# Patient Record
Sex: Female | Born: 1975 | Race: White | Hispanic: No | Marital: Married | Smoking: Never smoker
Health system: Southern US, Community
[De-identification: ages and names within clinical notes are randomized; demographics above are authoritative.]

## PROBLEM LIST (undated history)

## (undated) DIAGNOSIS — F4323 Adjustment disorder with mixed anxiety and depressed mood: Secondary | ICD-10-CM

## (undated) DIAGNOSIS — R87610 Atypical squamous cells of undetermined significance on cytologic smear of cervix (ASC-US): Secondary | ICD-10-CM

## (undated) DIAGNOSIS — F419 Anxiety disorder, unspecified: Secondary | ICD-10-CM

## (undated) DIAGNOSIS — E559 Vitamin D deficiency, unspecified: Secondary | ICD-10-CM

## (undated) DIAGNOSIS — Z87898 Personal history of other specified conditions: Secondary | ICD-10-CM

## (undated) DIAGNOSIS — K589 Irritable bowel syndrome without diarrhea: Secondary | ICD-10-CM

## (undated) DIAGNOSIS — T7840XA Allergy, unspecified, initial encounter: Secondary | ICD-10-CM

## (undated) HISTORY — DX: Adjustment disorder with mixed anxiety and depressed mood: F43.23

## (undated) HISTORY — DX: Personal history of other specified conditions: Z87.898

## (undated) HISTORY — PX: ABDOMINAL HYSTERECTOMY: SHX81

## (undated) HISTORY — PX: TONSILLECTOMY: SUR1361

## (undated) HISTORY — DX: Anxiety disorder, unspecified: F41.9

## (undated) HISTORY — DX: Irritable bowel syndrome, unspecified: K58.9

## (undated) HISTORY — PX: ENDOMETRIAL CRYOABLATION: SHX1499

## (undated) HISTORY — DX: Vitamin D deficiency, unspecified: E55.9

## (undated) HISTORY — PX: EYE MUSCLE SURGERY: SHX370

## (undated) HISTORY — DX: Allergy, unspecified, initial encounter: T78.40XA

## (undated) HISTORY — DX: Atypical squamous cells of undetermined significance on cytologic smear of cervix (ASC-US): R87.610

## (undated) HISTORY — PX: WISDOM TOOTH EXTRACTION: SHX21

---

## 2000-07-14 ENCOUNTER — Other Ambulatory Visit: Admission: RE | Admit: 2000-07-14 | Discharge: 2000-07-14 | Payer: Self-pay | Admitting: Family Medicine

## 2001-07-27 ENCOUNTER — Other Ambulatory Visit: Admission: RE | Admit: 2001-07-27 | Discharge: 2001-07-27 | Payer: Self-pay | Admitting: Family Medicine

## 2002-08-30 ENCOUNTER — Other Ambulatory Visit: Admission: RE | Admit: 2002-08-30 | Discharge: 2002-08-30 | Payer: Self-pay | Admitting: Family Medicine

## 2004-05-14 ENCOUNTER — Ambulatory Visit: Payer: Self-pay | Admitting: Family Medicine

## 2004-08-12 ENCOUNTER — Emergency Department (HOSPITAL_COMMUNITY): Admission: EM | Admit: 2004-08-12 | Discharge: 2004-08-12 | Payer: Self-pay | Admitting: Emergency Medicine

## 2004-09-17 ENCOUNTER — Ambulatory Visit: Payer: Self-pay | Admitting: Family Medicine

## 2004-10-11 ENCOUNTER — Ambulatory Visit: Payer: Self-pay | Admitting: Family Medicine

## 2004-11-05 ENCOUNTER — Encounter: Admission: RE | Admit: 2004-11-05 | Discharge: 2004-11-05 | Payer: Self-pay | Admitting: Family Medicine

## 2005-06-16 ENCOUNTER — Ambulatory Visit: Payer: Self-pay | Admitting: Family Medicine

## 2005-07-29 ENCOUNTER — Ambulatory Visit: Payer: Self-pay | Admitting: Family Medicine

## 2005-09-14 ENCOUNTER — Ambulatory Visit: Payer: Self-pay | Admitting: Family Medicine

## 2007-09-07 LAB — HEPATIC FUNCTION PANEL
ALT: 12 U/L (ref 7–35)
AST: 19 U/L (ref 13–35)
Alkaline Phosphatase: 60 U/L (ref 25–125)
Bilirubin, Total: 0.6 mg/dL

## 2007-12-07 ENCOUNTER — Ambulatory Visit (HOSPITAL_BASED_OUTPATIENT_CLINIC_OR_DEPARTMENT_OTHER): Admission: RE | Admit: 2007-12-07 | Discharge: 2007-12-07 | Payer: Self-pay | Admitting: Ophthalmology

## 2009-03-27 LAB — CBC AND DIFFERENTIAL
HCT: 40 % (ref 36–46)
Hemoglobin: 12.8 g/dL (ref 12.0–16.0)
PLATELETS: 176 10*3/uL (ref 150–399)
WBC: 3.9 10*3/mL

## 2009-03-27 LAB — BASIC METABOLIC PANEL
BUN: 19 mg/dL (ref 4–21)
CREATININE: 0.7 mg/dL (ref <=1.1)
GLUCOSE: 89 mg/dL
POTASSIUM: 5.1 mmol/L (ref 3.4–5.3)
Sodium: 139 mmol/L (ref 137–147)

## 2010-07-18 ENCOUNTER — Encounter: Payer: Self-pay | Admitting: Family Medicine

## 2010-11-09 NOTE — Op Note (Signed)
Victoria Burch, Victoria Burch             ACCOUNT NO.:  [id number]   MEDICAL RECORD NO.:  [id number]          PATIENT TYPE:  AMB   LOCATION:  DSC                          FACILITY:  [facility]   PHYSICIAN:  Pasty Spillers. Maple Hudson, M.D. DATE OF BIRTH:  Dec 16, 1975   DATE OF PROCEDURE:  12/07/2007  DATE OF DISCHARGE:                               OPERATIVE REPORT   PREOPERATIVE DIAGNOSIS:  Exotropia.   POSTOPERATIVE DIAGNOSIS:  Exotropia.   PROCEDURE:  Left lateral rectus muscle recession, 7.0 mm, adjustable  technique.   SURGEON:  Pasty Spillers. Young, MD   ANESTHESIA:  General (laryngeal mask).   COMPLICATIONS:  None.   DESCRIPTION OF PROCEDURE:  After routine preop evaluation including  informed consent, the patient was taken to the operating room where she  was identified by me.  General anesthesia was induced without difficulty  after placement of appropriate monitors.  The patient was prepped and  draped in standard sterile fashion.  A lid speculum was placed in each  eye in turn and exaggerated forced traction testing was carried out,  confirming moderate tightness of each superior oblique tendon.   A limbal conjunctival peritomy of 2 o'clock hours extent was made  temporally in the left eye with Westcott scissors, with relaxing  incisions in the superotemporal and inferotemporal quadrants.  The left  lateral rectus muscle was engaged on a series of muscle hooks and  cleared of its fascial attachments and scar tissue from its two previous  surgeries.  The tendon was secured with a double-arm 6-0 Vicryl suture,  with a double-locking bite at each border of the muscle, 1 mm from the  insertion.  The muscle was disinserted.  The current insertion was  measured to be 7.0 mm posterior to the limbus.  Each pole suture was  passed back through the original stump in crossed swords fashion.  The  muscle was drawn up to the level of the original insertion.  The pole  sutures were tied together 10 cm  above sclera.  The pole sutures were  joined together with a needle driver at a measured distance of 7.0 mm  above sclera, and a noose suture of 6-0 Vicryl was passed around the  pole sutures at this location.  The muscle was allowed to hang back  until the new suture reached sclera, effecting a 7.0 mm adjustable hang  back.  The 6-0 silk traction suture which had been placed at the  superior and inferior limbus at the beginning of the case was moved to  the nasal limbus.  The superior corner of the conjunctival flap was  reattached to the sclera with a 6-0 plain gut suture, just superior and  temporal to the superior end of the lateral rectus insertion.  The  relaxing incision was closed with a single 6-0 plain gut suture.  The  inferior corner of the flap was joined to the limbus with a large loop  of 6-0 Vicryl, which was left open to allow access to the muscle for  suture adjustment.  The limbal peritomy was continued superiorly, and  the left superior rectus  muscle was engaged on a series of muscle hooks.  The muscle was found approximately 10 mm posterior to the limbus.  It  was not possible to identify the superior oblique insertion under the  superior rectus muscle, and it was elected not to disinsert the superior  rectus so as to avoid the possibility of disrupting the superior oblique  insertion.  Thus, the original plan of bilateral superior oblique  posterior tenotomy was abandoned.  It was elected not to do a full nasal  tenotomy of each superior oblique.  The temporal corner of the superior  conjunctival flap was re-apposed to limbus with a 6-0 plain gut suture.  The pole, noose, and traction  sutures were taped to the left cheek, and TobraDex ointment was placed  in the left eye.  The patient was awakened without difficulty and taken  to the recovery room in stable condition, having suffered no  intraoperative or immediate postoperative complications.      Pasty Spillers.  Maple Hudson, M.D.  Electronically Signed     WOY/MEDQ  D:  12/07/2007  T:  12/08/2007  Job:  [id number]

## 2011-03-24 LAB — POCT HEMOGLOBIN-HEMACUE: Hemoglobin: 13.5

## 2012-01-25 ENCOUNTER — Ambulatory Visit (INDEPENDENT_AMBULATORY_CARE_PROVIDER_SITE_OTHER): Payer: BC Managed Care – PPO | Admitting: Medical

## 2012-01-25 ENCOUNTER — Encounter: Payer: Self-pay | Admitting: Medical

## 2012-01-25 VITALS — BP 110/70 | HR 79 | Temp 98.6°F | Resp 16 | Ht 62.0 in | Wt 138.0 lb

## 2012-01-25 DIAGNOSIS — J029 Acute pharyngitis, unspecified: Secondary | ICD-10-CM

## 2012-01-25 LAB — POCT RAPID STREP A (OFFICE): Rapid Strep A Screen: NEGATIVE

## 2012-01-25 MED ORDER — AMOXICILLIN 875 MG PO TABS: 875.0000 mg | ORAL_TABLET | Freq: Two times a day (BID) | ORAL | Status: AC

## 2012-01-25 NOTE — Progress Notes (Signed)
Subjective: New patient today, friend of mine here for c/o sore throat x 3 days since Monday.   Glands feel swollen, feels nausea, headache, but no fever.  Her daughter Baxter Hire was + for strep Monday.  Using salt water gargles, Ibuprofen.   She does have hx/o tonsillectomy.     Objective:      Filed Vitals:   01/25/12 1426  BP: 110/70  Pulse: 79  Temp: 98.6 F (37 C)  Resp: 16    General appearance: no distress, WD/WN, somewhat ill-appearing HEENT: normocephalic, conjunctiva/corneas normal, sclerae anicteric, nares patent, no discharge or erythema,TMs pearly, pharynx with erythema, no exudate.  Oral cavity: MMM, no lesions  Neck: supple, shoddy lymphadenopathy, no thyromegaly Lungs: CTA bilaterally, no wheezes, rhonchi, or rales  Laboratory Strep test done. Results:negative.    Assessment and Plan:   Encounter Diagnosis  Name Primary?  . Pharyngitis Yes    Given worse symptoms, +strep contact in household, and at least 2+ centor criteria, will cover for possible strep.  Script for amoxicillin.  Discussed symptomatic treatment including salt water gargles, warm fluids, rest, hydrate well, can use over-the-counter Tylenol or Ibuprofen for throat pain, fever, or malaise. If worse or not improving within 2-3 days, call or return.

## 2012-01-25 NOTE — Addendum Note (Signed)
Addended by: Janeice Robinson on: 01/25/2012 02:55 PM   Modules accepted: Orders

## 2013-05-02 ENCOUNTER — Other Ambulatory Visit: Payer: Self-pay | Admitting: Medical

## 2013-05-02 MED ORDER — POLYMYXIN B-TRIMETHOPRIM 10000-0.1 UNIT/ML-% OP SOLN: 1.0000 [drp] | OPHTHALMIC | Status: DC

## 2015-08-27 ENCOUNTER — Other Ambulatory Visit (INDEPENDENT_AMBULATORY_CARE_PROVIDER_SITE_OTHER): Payer: BLUE CROSS/BLUE SHIELD

## 2015-08-27 DIAGNOSIS — Z23 Encounter for immunization: Secondary | ICD-10-CM

## 2015-11-10 DIAGNOSIS — D485 Neoplasm of uncertain behavior of skin: Secondary | ICD-10-CM | POA: Diagnosis not present

## 2015-11-10 DIAGNOSIS — D225 Melanocytic nevi of trunk: Secondary | ICD-10-CM | POA: Diagnosis not present

## 2015-11-10 DIAGNOSIS — D1801 Hemangioma of skin and subcutaneous tissue: Secondary | ICD-10-CM | POA: Diagnosis not present

## 2015-11-10 DIAGNOSIS — D2271 Melanocytic nevi of right lower limb, including hip: Secondary | ICD-10-CM | POA: Diagnosis not present

## 2015-12-02 DIAGNOSIS — F411 Generalized anxiety disorder: Secondary | ICD-10-CM | POA: Diagnosis not present

## 2015-12-17 ENCOUNTER — Encounter: Payer: Self-pay | Admitting: General Practice

## 2016-01-15 ENCOUNTER — Encounter: Payer: Self-pay | Admitting: Family Medicine

## 2016-01-15 ENCOUNTER — Ambulatory Visit (INDEPENDENT_AMBULATORY_CARE_PROVIDER_SITE_OTHER): Payer: BLUE CROSS/BLUE SHIELD | Admitting: Family Medicine

## 2016-01-15 VITALS — BP 106/76 | HR 71 | Temp 98.1°F | Resp 16 | Ht 62.0 in | Wt 149.0 lb

## 2016-01-15 DIAGNOSIS — Q678 Other congenital deformities of chest: Secondary | ICD-10-CM | POA: Insufficient documentation

## 2016-01-15 DIAGNOSIS — Z0000 Encounter for general adult medical examination without abnormal findings: Secondary | ICD-10-CM

## 2016-01-15 NOTE — Assessment & Plan Note (Signed)
Pt's PE WNL w/ exception of chest asymmetry.  UTD on Tdap.  No need for pap.  Check labs.  Anticipatory guidance provided.

## 2016-01-15 NOTE — Progress Notes (Signed)
Pre visit review using our clinic review tool, if applicable. No additional management support is needed unless otherwise documented below in the visit note. 

## 2016-01-15 NOTE — Assessment & Plan Note (Signed)
New.  Pt w/ L chest wall abnormality at sternal/rib junction.  + TTP.  Get [name] to assess as this is not a true breast abnormality but more a chest wall issue.  Pt expressed understanding and is in agreement w/ plan.

## 2016-01-15 NOTE — Progress Notes (Signed)
   Subjective:    Patient ID: Victoria Burch, female    DOB: 1975/11/03, 40 y.o.   MRN: [id number]  HPI New to establish.  Previous MD- [facility], [place]  CPE- UTD on Tdap, no need for pap due to TAH   Review of Systems Patient reports no vision/ hearing changes, adenopathy,fever, weight change,  persistant/recurrent hoarseness , swallowing issues, chest pain, palpitations, edema, persistant/recurrent cough, hemoptysis, dyspnea (rest/exertional/paroxysmal nocturnal), gastrointestinal bleeding (melena, rectal bleeding), abdominal pain, significant heartburn, bowel changes, GU symptoms (dysuria, hematuria, incontinence), Gyn symptoms (abnormal  bleeding, pain),  syncope, focal weakness, memory loss, numbness & tingling, skin/hair/nail changes, abnormal bruising or bleeding, anxiety, or depression.     Objective:   Physical Exam  General Appearance:    Alert, cooperative, no distress, appears stated age  Head:    Normocephalic, without obvious abnormality, atraumatic  Eyes:    PERRL, conjunctiva/corneas clear, EOM's intact, fundi    benign, both eyes  Ears:    Normal TM's and external ear canals, both ears  Nose:   Nares normal, septum midline, mucosa normal, no drainage    or sinus tenderness  Throat:   Lips, mucosa, and tongue normal; teeth and gums normal  Neck:   Supple, symmetrical, trachea midline, no adenopathy;    Thyroid: no enlargement/tenderness/nodules  Back:     Symmetric, no curvature, ROM normal, no CVA tenderness  Lungs:     Clear to auscultation bilaterally, respirations unlabored  Chest Wall:    Mild TTP over L sternal rib attachments just medial to L breast w/ noted asymmetry of chest wall.   Heart:    Regular rate and rhythm, S1 and S2 normal, no murmur, rub   or gallop  Breast Exam:    No tenderness, masses, or nipple abnormality  Abdomen:     Soft, non-tender, bowel sounds active all four quadrants,    no masses, no organomegaly  Genitalia:     deferred  Rectal:    Extremities:   Extremities normal, atraumatic, no cyanosis or edema  Pulses:   2+ and symmetric all extremities  Skin:   Skin color, texture, turgor normal, no rashes or lesions  Lymph nodes:   Cervical, supraclavicular, and axillary nodes normal  Neurologic:   CNII-XII intact, normal strength, sensation and reflexes    throughout          Assessment & Plan:

## 2016-01-15 NOTE — Patient Instructions (Signed)
Schedule a lab visit at your convenience Follow up in 1 year or as needed We'll notify you of your lab results and make any changes if needed Continue to work on healthy diet and regular exercise- you look great! Call with any questions or concerns Welcome!  We're glad to have you! Have a great summer!!!

## 2016-01-29 ENCOUNTER — Other Ambulatory Visit: Payer: BLUE CROSS/BLUE SHIELD

## 2016-01-29 ENCOUNTER — Ambulatory Visit: Payer: Self-pay | Admitting: Family Medicine

## 2016-02-05 ENCOUNTER — Other Ambulatory Visit: Payer: Self-pay | Admitting: Family Medicine

## 2016-02-05 ENCOUNTER — Other Ambulatory Visit (INDEPENDENT_AMBULATORY_CARE_PROVIDER_SITE_OTHER): Payer: BLUE CROSS/BLUE SHIELD

## 2016-02-05 ENCOUNTER — Other Ambulatory Visit: Payer: BLUE CROSS/BLUE SHIELD

## 2016-02-05 ENCOUNTER — Ambulatory Visit
Admission: RE | Admit: 2016-02-05 | Discharge: 2016-02-05 | Disposition: A | Discharge status: Home / Self Care | Payer: BLUE CROSS/BLUE SHIELD | Source: Ambulatory Visit | Attending: Family Medicine | Admitting: Family Medicine

## 2016-02-05 DIAGNOSIS — Q678 Other congenital deformities of chest: Secondary | ICD-10-CM

## 2016-02-05 DIAGNOSIS — Z0000 Encounter for general adult medical examination without abnormal findings: Secondary | ICD-10-CM

## 2016-02-05 DIAGNOSIS — N6320 Unspecified lump in the left breast, unspecified quadrant: Principal | ICD-10-CM

## 2016-02-05 DIAGNOSIS — N6325 Unspecified lump in the left breast, overlapping quadrants: Secondary | ICD-10-CM

## 2016-02-05 LAB — CBC WITH DIFFERENTIAL/PLATELET
BASOS ABS: 0 10*3/uL (ref 0.0–0.1)
Basophils Relative: 0.5 % (ref 0.0–3.0)
EOS ABS: 0.1 10*3/uL (ref 0.0–0.7)
Eosinophils Relative: 2.6 % (ref 0.0–5.0)
HEMATOCRIT: 42.6 % (ref 36.0–46.0)
HEMOGLOBIN: 14.6 g/dL (ref 12.0–15.0)
LYMPHS PCT: 38.9 % (ref 12.0–46.0)
Lymphs Abs: 1.8 10*3/uL (ref 0.7–4.0)
MCHC: 34.2 g/dL (ref 30.0–36.0)
MCV: 86.9 fl (ref 78.0–100.0)
Monocytes Absolute: 0.4 10*3/uL (ref 0.1–1.0)
Monocytes Relative: 7.8 % (ref 3.0–12.0)
Neutro Abs: 2.3 10*3/uL (ref 1.4–7.7)
Neutrophils Relative %: 50.2 % (ref 43.0–77.0)
Platelets: 214 10*3/uL (ref 150.0–400.0)
RBC: 4.9 Mil/uL (ref 3.87–5.11)
RDW: 13.7 % (ref 11.5–15.5)
WBC: 4.6 10*3/uL (ref 4.0–10.5)

## 2016-02-05 LAB — BASIC METABOLIC PANEL
BUN: 14 mg/dL (ref 6–23)
CALCIUM: 10.1 mg/dL (ref 8.4–10.5)
CO2: 29 meq/L (ref 19–32)
CREATININE: 0.71 mg/dL (ref 0.40–1.20)
Chloride: 104 mEq/L (ref 96–112)
GFR: 97.82 mL/min (ref >60.00)
GLUCOSE: 97 mg/dL (ref 70–99)
Potassium: 4.2 mEq/L (ref 3.5–5.1)
Sodium: 141 mEq/L (ref 135–145)

## 2016-02-05 LAB — LIPID PANEL
CHOLESTEROL: 174 mg/dL (ref 0–200)
HDL: 65.1 mg/dL (ref >39.00)
LDL Cholesterol: 89 mg/dL (ref 0–99)
NONHDL: 109.05
Total CHOL/HDL Ratio: 3
Triglycerides: 99 mg/dL (ref 0.0–149.0)
VLDL: 19.8 mg/dL (ref 0.0–40.0)

## 2016-02-05 LAB — HEPATIC FUNCTION PANEL
ALT: 15 U/L (ref 0–35)
AST: 17 U/L (ref 0–37)
Albumin: 4.9 g/dL (ref 3.5–5.2)
Alkaline Phosphatase: 62 U/L (ref 39–117)
Bilirubin, Direct: 0.1 mg/dL (ref 0.0–0.3)
TOTAL PROTEIN: 7.3 g/dL (ref 6.0–8.3)
Total Bilirubin: 0.6 mg/dL (ref 0.2–1.2)

## 2016-02-05 LAB — VITAMIN D 25 HYDROXY (VIT D DEFICIENCY, FRACTURES): VITD: 24.93 ng/mL — ABNORMAL LOW (ref 30.00–100.00)

## 2016-02-05 LAB — TSH: TSH: 1.01 u[IU]/mL (ref 0.35–4.50)

## 2016-02-08 ENCOUNTER — Other Ambulatory Visit: Payer: Self-pay | Admitting: General Practice

## 2016-02-08 DIAGNOSIS — N6325 Unspecified lump in the left breast, overlapping quadrants: Secondary | ICD-10-CM

## 2016-02-08 DIAGNOSIS — N6320 Unspecified lump in the left breast, unspecified quadrant: Principal | ICD-10-CM

## 2016-02-08 MED ORDER — VITAMIN D (ERGOCALCIFEROL) 1.25 MG (50000 UNIT) PO CAPS: 50000.0000 [IU] | ORAL_CAPSULE | ORAL | 0 refills | Status: DC

## 2016-02-08 NOTE — Addendum Note (Signed)
Addended by: Davis Gourd on: 02/08/2016 02:04 PM   Modules accepted: Orders

## 2016-02-19 ENCOUNTER — Ambulatory Visit
Admission: RE | Admit: 2016-02-19 | Discharge: 2016-02-19 | Disposition: A | Discharge status: Home / Self Care | Payer: BLUE CROSS/BLUE SHIELD | Source: Ambulatory Visit | Attending: Family Medicine | Admitting: Family Medicine

## 2016-02-19 DIAGNOSIS — N6325 Unspecified lump in the left breast, overlapping quadrants: Secondary | ICD-10-CM

## 2016-02-19 DIAGNOSIS — R922 Inconclusive mammogram: Secondary | ICD-10-CM | POA: Diagnosis not present

## 2016-02-19 DIAGNOSIS — N6489 Other specified disorders of breast: Secondary | ICD-10-CM | POA: Diagnosis not present

## 2016-02-19 DIAGNOSIS — N6320 Unspecified lump in the left breast, unspecified quadrant: Principal | ICD-10-CM

## 2016-03-02 DIAGNOSIS — F429 Obsessive-compulsive disorder, unspecified: Secondary | ICD-10-CM | POA: Diagnosis not present

## 2016-05-05 ENCOUNTER — Other Ambulatory Visit: Payer: Self-pay | Admitting: Family Medicine

## 2016-06-01 DIAGNOSIS — F429 Obsessive-compulsive disorder, unspecified: Secondary | ICD-10-CM | POA: Diagnosis not present

## 2016-06-01 DIAGNOSIS — F419 Anxiety disorder, unspecified: Secondary | ICD-10-CM | POA: Diagnosis not present

## 2016-07-21 ENCOUNTER — Ambulatory Visit (INDEPENDENT_AMBULATORY_CARE_PROVIDER_SITE_OTHER): Payer: BLUE CROSS/BLUE SHIELD | Admitting: Physician Assistant

## 2016-07-21 ENCOUNTER — Ambulatory Visit (HOSPITAL_BASED_OUTPATIENT_CLINIC_OR_DEPARTMENT_OTHER)
Admission: RE | Admit: 2016-07-21 | Discharge: 2016-07-21 | Disposition: A | Discharge status: Home / Self Care | Payer: BLUE CROSS/BLUE SHIELD | Source: Ambulatory Visit | Attending: Physician Assistant | Admitting: Physician Assistant

## 2016-07-21 ENCOUNTER — Other Ambulatory Visit: Payer: Self-pay | Admitting: Physician Assistant

## 2016-07-21 ENCOUNTER — Encounter: Payer: Self-pay | Admitting: Physician Assistant

## 2016-07-21 VITALS — BP 110/76 | HR 101 | Temp 98.3°F | Resp 16 | Ht 62.0 in | Wt 149.0 lb

## 2016-07-21 DIAGNOSIS — K6389 Other specified diseases of intestine: Secondary | ICD-10-CM

## 2016-07-21 DIAGNOSIS — K529 Noninfective gastroenteritis and colitis, unspecified: Principal | ICD-10-CM

## 2016-07-21 DIAGNOSIS — R1032 Left lower quadrant pain: Secondary | ICD-10-CM

## 2016-07-21 LAB — POCT URINALYSIS DIPSTICK
Bilirubin, UA: NEGATIVE
Glucose, UA: NEGATIVE
Ketones, UA: NEGATIVE
LEUKOCYTES UA: NEGATIVE
NITRITE UA: NEGATIVE
PH UA: 7
PROTEIN UA: NEGATIVE
RBC UA: NEGATIVE
Spec Grav, UA: 1.015
UROBILINOGEN UA: 0.2

## 2016-07-21 LAB — CBC WITH DIFFERENTIAL/PLATELET
BASOS ABS: 0.1 10*3/uL (ref 0.0–0.1)
Basophils Relative: 1.1 % (ref 0.0–3.0)
EOS ABS: 0.1 10*3/uL (ref 0.0–0.7)
Eosinophils Relative: 1.5 % (ref 0.0–5.0)
HCT: 41.2 % (ref 36.0–46.0)
Hemoglobin: 14.3 g/dL (ref 12.0–15.0)
LYMPHS ABS: 1.5 10*3/uL (ref 0.7–4.0)
LYMPHS PCT: 22.8 % (ref 12.0–46.0)
MCHC: 34.6 g/dL (ref 30.0–36.0)
MCV: 86.6 fl (ref 78.0–100.0)
MONOS PCT: 6.3 % (ref 3.0–12.0)
Monocytes Absolute: 0.4 10*3/uL (ref 0.1–1.0)
NEUTROS ABS: 4.5 10*3/uL (ref 1.4–7.7)
NEUTROS PCT: 68.3 % (ref 43.0–77.0)
PLATELETS: 200 10*3/uL (ref 150.0–400.0)
RBC: 4.76 Mil/uL (ref 3.87–5.11)
RDW: 13.8 % (ref 11.5–15.5)
WBC: 6.5 10*3/uL (ref 4.0–10.5)

## 2016-07-21 MED ORDER — CIPROFLOXACIN HCL 500 MG PO TABS: 500.0000 mg | ORAL_TABLET | Freq: Two times a day (BID) | ORAL | 0 refills | Status: DC

## 2016-07-21 MED ORDER — IBUPROFEN 600 MG PO TABS: 600.0000 mg | ORAL_TABLET | Freq: Three times a day (TID) | ORAL | 0 refills | Status: DC | PRN

## 2016-07-21 MED ORDER — METRONIDAZOLE 500 MG PO TABS: 500.0000 mg | ORAL_TABLET | Freq: Three times a day (TID) | ORAL | 0 refills | Status: DC

## 2016-07-21 MED ORDER — IOPAMIDOL (ISOVUE-300) INJECTION 61%
100.0000 mL | Freq: Once | INTRAVENOUS | Status: AC | PRN
  Administered 2016-07-21: 100 mL via INTRAVENOUS

## 2016-07-21 NOTE — Progress Notes (Signed)
Pre visit review using our clinic review tool, if applicable. No additional management support is needed unless otherwise documented below in the visit note. 

## 2016-07-21 NOTE — Patient Instructions (Signed)
Please go to the lab for blood work. I will call you with your results.  Go to [facility] [address] [place], [place] [address]  This is for your CT scan. They will call me with your results. I am treating for suspected diverticulitis.  The CT will help [name] confirm or assess for other cause.  I have sent in 2 antibiotics for diverticulitis for you to start taking once diagnosis is confirmed.  Do not drink while on these medications.  If anything acutely worsens before your imaging, please go to the ER.

## 2016-07-21 NOTE — Progress Notes (Signed)
Patient presents to clinic today c/o 2 days of LLQ pain described as sharp and sometimes aching. Notes skiing the weekend before and felt symptoms were related to activity. Symptoms have persisted despite rest. Patient does note history of chronic constipation and straining. LBM this am without any tenesmus, melena or hematochezia. Denies urinary urgency, frequency, hematuria. Denies fever, chills.  Endorses some nausea. Denies vomiting. Patient is s/p hysterectomy in 2016. Has both ovaries. Denies vaginal or pelvic symptoms. Patient denies history of diverticulosis or diverticulitis.   Past Medical History:  Diagnosis Date  . Adjustment disorder with mixed anxiety and depressed mood   . Atypical squamous cells of undetermined significance on cytologic smear of cervix (ASC-US)   . History of headache     Current Outpatient Prescriptions on File Prior to Visit  Medication Sig Dispense Refill  . busPIRone (BUSPAR) 10 MG tablet TK 1/2 T PO BID  2  . loratadine (CLARITIN) 10 MG tablet Take 10 mg by mouth daily.     No current facility-administered medications on file prior to visit.     Allergies  Allergen Reactions  . Penicillins Other (See Comments)    Some stomach upset    Family History  Problem Relation Age of Onset  . Alcohol abuse Father   . Arthritis Father   . Mental illness Father   . Arthritis Maternal Grandmother   . Hypertension Maternal Grandmother   . Arthritis Maternal Grandfather   . Cancer Maternal Grandfather     prostate  . Hypertension Maternal Grandfather   . Arthritis Paternal Grandmother   . Mental illness Paternal Grandmother   . Alcohol abuse Paternal Grandfather   . Arthritis Paternal Grandfather   . Cancer Paternal Grandfather     lung    Social History   Social History  . Marital status: Married    Spouse name: N/A  . Number of children: N/A  . Years of education: N/A   Social History Main Topics  . Smoking status: Never Smoker  .  Smokeless tobacco: Never Used  . Alcohol use Yes  . Drug use: No  . Sexual activity: Not Asked   Other Topics Concern  . None   Social History Narrative  . None    Review of Systems - See HPI.  All other ROS are negative.  BP 110/76   Pulse (!) 101   Temp 98.3 F (36.8 C) (Oral)   Resp 16   Ht 5\' 2"  (1.575 m)   Wt 149 lb (67.6 kg)   SpO2 99%   BMI 27.25 kg/m   Physical Exam  Constitutional: She is well-developed, well-nourished, and in no distress.  HENT:  Head: Normocephalic and atraumatic.  Eyes: Conjunctivae are normal.  Neck: Neck supple.  Cardiovascular: Normal rate, regular rhythm, normal heart sounds and intact distal pulses.   Pulmonary/Chest: Effort normal and breath sounds normal. No respiratory distress. She has no wheezes. She has no rales. She exhibits no tenderness.  Abdominal: Soft. Bowel sounds are normal. She exhibits no mass. There is tenderness in the left lower quadrant. There is no rebound, no guarding and no tenderness at McBurney's point.  Vitals reviewed.  Assessment/Plan: 1. LLQ pain Seems consistent with diverticulitis but patient without known diverticula. Afebrile, non-toxic. Some LLQ pain on exam. Will check CBC w diff and CT Abdomen/pelvis. Plan to start Cipro and Flagyl if diverticulitis noted on STAT CT. Supportive measures reviewed. - CBC w/Diff - ciprofloxacin (CIPRO) 500 MG tablet; Take 1 tablet (  500 mg total) by mouth 2 (two) times daily.  Dispense: 14 tablet; Refill: 0 - POCT Urinalysis Dipstick - CT Abdomen Pelvis W Contrast; Future  CT reveals epiploic appendagitis. Start Cipro and NSAIDs. Supportive measures and OTC medications reviewed. FU scheduled. Discussed usual benign course of this illness. Usually self-limited. If not resolving will need surgical assessment. Urgent referral to Gen Surg placed in case appt is needed.   ER precautions discussed with patient.  Leeanne Rio, PA-C

## 2016-07-22 ENCOUNTER — Telehealth: Payer: Self-pay | Admitting: Family Medicine

## 2016-07-22 NOTE — Telephone Encounter (Signed)
Spoke with patient. No fever. Pain improved today. ABX causing nausea. Discussed we can stop antibiotic since no infection present just the epiploic appendagitis. Will continue NSAIDs and monitor symptoms.

## 2016-07-22 NOTE — Telephone Encounter (Signed)
(  Voicemail message received on phone at front desk.  Message on 07/22/16 at 10:48am)  Patient states she was seen in office yesterday for abdominal pain and based on the findings from CT scan, she was diagnosed with appendijitis, not appendicitis.  She was prescribed both an anti-inflammatory and an antibiotic.    She is asking for an explanation as to why an antibiotic was prescribed.  She thinks the antibiotic is causing her to have an upset stomach and she wants to know if it benefits her to continue taking it.

## 2016-07-25 ENCOUNTER — Ambulatory Visit (INDEPENDENT_AMBULATORY_CARE_PROVIDER_SITE_OTHER): Payer: BLUE CROSS/BLUE SHIELD | Admitting: Physician Assistant

## 2016-07-25 ENCOUNTER — Encounter: Payer: Self-pay | Admitting: Physician Assistant

## 2016-07-25 VITALS — BP 110/70 | HR 81 | Temp 98.6°F | Resp 14 | Ht 62.0 in | Wt 149.0 lb

## 2016-07-25 DIAGNOSIS — K529 Noninfective gastroenteritis and colitis, unspecified: Secondary | ICD-10-CM | POA: Diagnosis not present

## 2016-07-25 DIAGNOSIS — K6389 Other specified diseases of intestine: Secondary | ICD-10-CM

## 2016-07-25 NOTE — Progress Notes (Signed)
Patient presents to clinic today for follow-up of epiploic appendagitis, diagnosed on CT earlier this week. Patient is taking anti-inflammatories as directed. Has been taking it easy. Notes marked improvement in LLQ pain. Endorses return of appetite. Endorses good hydration, urination and bowel habits. Denies melena or hematochezia. Denies fever, chills. Denies any new symptoms.   Past Medical History:  Diagnosis Date  . Adjustment disorder with mixed anxiety and depressed mood   . Atypical squamous cells of undetermined significance on cytologic smear of cervix (ASC-US)   . History of headache     Current Outpatient Prescriptions on File Prior to Visit  Medication Sig Dispense Refill  . Ascorbic Acid (VITAMIN C) 1000 MG tablet Take 1,000 mg by mouth daily.    . B Complex Vitamins (VITAMIN B COMPLEX) TABS Take 1 tablet by mouth daily.    . busPIRone (BUSPAR) 10 MG tablet TK 1/2 T PO BID  2  . calcium carbonate (OS-CAL - DOSED IN MG OF ELEMENTAL CALCIUM) 1250 (500 Ca) MG tablet Take 1 tablet by mouth daily with breakfast.    . Cholecalciferol (VITAMIN D) 2000 units CAPS Take 1 capsule by mouth daily.    . ciprofloxacin (CIPRO) 500 MG tablet Take 1 tablet (500 mg total) by mouth 2 (two) times daily. 14 tablet 0  . ibuprofen (ADVIL,MOTRIN) 600 MG tablet Take 1 tablet (600 mg total) by mouth every 8 (eight) hours as needed. 30 tablet 0  . loratadine (CLARITIN) 10 MG tablet Take 10 mg by mouth daily.    . Omega-3 Fatty Acids (FISH OIL) 1000 MG CPDR Take 1 capsule by mouth daily.     No current facility-administered medications on file prior to visit.     Allergies  Allergen Reactions  . Penicillins Other (See Comments)    Some stomach upset    Family History  Problem Relation Age of Onset  . Alcohol abuse Father   . Arthritis Father   . Mental illness Father   . Arthritis Maternal Grandmother   . Hypertension Maternal Grandmother   . Arthritis Maternal Grandfather   . Cancer  Maternal Grandfather     prostate  . Hypertension Maternal Grandfather   . Arthritis Paternal Grandmother   . Mental illness Paternal Grandmother   . Alcohol abuse Paternal Grandfather   . Arthritis Paternal Grandfather   . Cancer Paternal Grandfather     lung    Social History   Social History  . Marital status: Married    Spouse name: N/A  . Number of children: N/A  . Years of education: N/A   Social History Main Topics  . Smoking status: Never Smoker  . Smokeless tobacco: Never Used  . Alcohol use Yes  . Drug use: No  . Sexual activity: Not on file   Other Topics Concern  . Not on file   Social History Narrative  . No narrative on file    Review of Systems - See HPI.  All other ROS are negative.  There were no vitals taken for this visit.  Physical Exam  Constitutional: She is oriented to person, place, and time and well-developed, well-nourished, and in no distress.  HENT:  Head: Normocephalic and atraumatic.  Eyes: Conjunctivae are normal.  Neck: Neck supple.  Cardiovascular: Normal rate, regular rhythm, normal heart sounds and intact distal pulses.   Pulmonary/Chest: Effort normal and breath sounds normal. No respiratory distress. She has no wheezes. She has no rales. She exhibits no tenderness.  Abdominal: Soft.  Bowel sounds are normal. She exhibits no distension and no mass. There is no tenderness. There is no rebound and no guarding.  Lymphadenopathy:    She has no cervical adenopathy.  Neurological: She is alert and oriented to person, place, and time.  Skin: Skin is warm and dry. No rash noted.  Psychiatric: Affect normal.  Vitals reviewed.   Recent Results (from the past 2160 hour(s))  POCT Urinalysis Dipstick     Status: Normal   Collection Time: 07/21/16  9:21 AM  Result Value Ref Range   Color, UA yellow    Clarity, UA clear    Glucose, UA negative    Bilirubin, UA negative    Ketones, UA negative    Spec Grav, UA 1.015    Blood, UA  negative    pH, UA 7.0    Protein, UA negative    Urobilinogen, UA 0.2    Nitrite, UA negative    Leukocytes, UA Negative Negative  CBC w/Diff     Status: None   Collection Time: 07/21/16  9:22 AM  Result Value Ref Range   WBC 6.5 4.0 - 10.5 K/uL   RBC 4.76 3.87 - 5.11 Mil/uL   Hemoglobin 14.3 12.0 - 15.0 g/dL   HCT 41.2 36.0 - 46.0 %   MCV 86.6 78.0 - 100.0 fl   MCHC 34.6 30.0 - 36.0 g/dL   RDW 13.8 11.5 - 15.5 %   Platelets 200.0 150.0 - 400.0 K/uL   Neutrophils Relative % 68.3 43.0 - 77.0 %   Lymphocytes Relative 22.8 12.0 - 46.0 %   Monocytes Relative 6.3 3.0 - 12.0 %   Eosinophils Relative 1.5 0.0 - 5.0 %   Basophils Relative 1.1 0.0 - 3.0 %   Neutro Abs 4.5 1.4 - 7.7 K/uL   Lymphs Abs 1.5 0.7 - 4.0 K/uL   Monocytes Absolute 0.4 0.1 - 1.0 K/uL   Eosinophils Absolute 0.1 0.0 - 0.7 K/uL   Basophils Absolute 0.1 0.0 - 0.1 K/uL    Assessment/Plan: 1. Epiploic appendagitis Symptoms resolving. Continue NSAIDS and supportive measures. Can hold off on surgery assessment as symptoms resolving with conservative measures.    Leeanne Rio, PA-C

## 2016-07-25 NOTE — Progress Notes (Signed)
Pre visit review using our clinic review tool, if applicable. No additional management support is needed unless otherwise documented below in the visit note. 

## 2016-07-25 NOTE — Patient Instructions (Signed)
Please continue anti-inflammatories as directed to help resolve pain. Stay hydrated.  Continue good fiber supplementation to help promote easy passage of stools.  Nothing further needed as long as symptoms continue to resolve.

## 2016-08-08 ENCOUNTER — Encounter: Payer: Self-pay | Admitting: Family Medicine

## 2016-08-08 ENCOUNTER — Ambulatory Visit (INDEPENDENT_AMBULATORY_CARE_PROVIDER_SITE_OTHER): Payer: BLUE CROSS/BLUE SHIELD | Admitting: Family Medicine

## 2016-08-08 VITALS — BP 108/72 | HR 109 | Temp 98.5°F | Resp 17 | Ht 62.0 in | Wt 150.0 lb

## 2016-08-08 DIAGNOSIS — L739 Follicular disorder, unspecified: Secondary | ICD-10-CM | POA: Diagnosis not present

## 2016-08-08 MED ORDER — DOXYCYCLINE HYCLATE 100 MG PO TABS: 100.0000 mg | ORAL_TABLET | Freq: Two times a day (BID) | ORAL | 0 refills | Status: DC

## 2016-08-08 NOTE — Patient Instructions (Signed)
Follow up as needed or as scheduled Start the Doxycycline twice daily- take w/ food Try and avoid shaving to prevent re-inoculating the areas Warm moist compresses to help bring the area to a head DO NOT PICK/POP! Call with any questions or concerns Hang in there!!!

## 2016-08-08 NOTE — Progress Notes (Signed)
Pre visit review using our clinic review tool, if applicable. No additional management support is needed unless otherwise documented below in the visit note. 

## 2016-08-08 NOTE — Progress Notes (Signed)
   Subjective:    Patient ID: Victoria Burch, female    DOB: July 15, 1975, 41 y.o.   MRN: [id number]  HPI Rash- sxs first noticed ~1 week ago.  Started on L and now has similar area on R.  Tender to touch, burning.  Husband now has similar bumps in armpits after sharing a razor.  One on L was oozing yesterday.   Review of Systems For ROS see HPI     Objective:   Physical Exam  Constitutional: She is oriented to person, place, and time. She appears well-developed and well-nourished. No distress.  Neurological: She is alert and oriented to person, place, and time.  Skin: Skin is warm and dry.  Multiple areas under each arm consistent w/ early abscess/foliculitis.  No fluctuance or area to drain at this time  Psychiatric: She has a normal mood and affect. Her behavior is normal. Thought content normal.  Vitals reviewed.         Assessment & Plan:  Folliculitis- new.  Pt's 'rash' consistent w/ folliculitis that is not yet abscessed.  Start Doxy to cover for possible MRSA.  Warm compresses.  Reviewed supportive care and red flags that should prompt return.  Pt expressed understanding and is in agreement w/ plan.

## 2016-09-02 ENCOUNTER — Telehealth: Payer: Self-pay | Admitting: *Deleted

## 2016-09-02 DIAGNOSIS — L739 Follicular disorder, unspecified: Secondary | ICD-10-CM

## 2016-09-02 NOTE — Telephone Encounter (Signed)
Patient calling regarding DX from last visit.  She stated that she took the antibiotics and it got better - now that she has finished the antibiotics it has come back under her left arm.  She is questioning if she needs to come back in here to be seen, or if she just needs to see dermatology.      Patient is aware that I am forwarding the message on for advise.

## 2016-09-02 NOTE — Addendum Note (Signed)
Addended by: Davis Gourd on: 09/02/2016 10:15 AM   Modules accepted: Orders

## 2016-09-02 NOTE — Telephone Encounter (Signed)
Referral was placed 

## 2016-09-02 NOTE — Telephone Encounter (Signed)
Ok to refer to derm for recurrent folliculitis

## 2016-09-05 DIAGNOSIS — B958 Unspecified staphylococcus as the cause of diseases classified elsewhere: Secondary | ICD-10-CM | POA: Diagnosis not present

## 2016-09-05 DIAGNOSIS — L0889 Other specified local infections of the skin and subcutaneous tissue: Secondary | ICD-10-CM | POA: Diagnosis not present

## 2016-09-19 DIAGNOSIS — F429 Obsessive-compulsive disorder, unspecified: Secondary | ICD-10-CM | POA: Diagnosis not present

## 2016-09-19 DIAGNOSIS — F419 Anxiety disorder, unspecified: Secondary | ICD-10-CM | POA: Diagnosis not present

## 2016-10-10 DIAGNOSIS — B360 Pityriasis versicolor: Secondary | ICD-10-CM | POA: Diagnosis not present

## 2016-10-10 DIAGNOSIS — D485 Neoplasm of uncertain behavior of skin: Secondary | ICD-10-CM | POA: Diagnosis not present

## 2016-10-10 DIAGNOSIS — L738 Other specified follicular disorders: Secondary | ICD-10-CM | POA: Diagnosis not present

## 2016-10-21 ENCOUNTER — Encounter: Payer: Self-pay | Admitting: Family Medicine

## 2016-10-21 ENCOUNTER — Ambulatory Visit (INDEPENDENT_AMBULATORY_CARE_PROVIDER_SITE_OTHER): Payer: BLUE CROSS/BLUE SHIELD | Admitting: Family Medicine

## 2016-10-21 ENCOUNTER — Other Ambulatory Visit: Payer: Self-pay | Admitting: Family Medicine

## 2016-10-21 DIAGNOSIS — R1032 Left lower quadrant pain: Secondary | ICD-10-CM | POA: Diagnosis not present

## 2016-10-21 DIAGNOSIS — K529 Noninfective gastroenteritis and colitis, unspecified: Secondary | ICD-10-CM | POA: Diagnosis not present

## 2016-10-21 DIAGNOSIS — K6389 Other specified diseases of intestine: Secondary | ICD-10-CM | POA: Insufficient documentation

## 2016-10-21 MED ORDER — POLYETHYLENE GLYCOL 3350 17 GM/SCOOP PO POWD: 17.0000 g | Freq: Every day | ORAL | 1 refills | Status: DC

## 2016-10-21 NOTE — Patient Instructions (Signed)
Follow up as needed We'll call you with your ultrasound appt Start Miralax- 1 capful- daily to improve constipation Drink plenty of fluids Depending on the results of your ultrasound, we'll determine the next steps Call with any questions or concerns Hang in there!!  We'll figure this out!

## 2016-10-21 NOTE — Progress Notes (Signed)
   Subjective:    Patient ID: Victoria Burch, female    DOB: 1975/10/16, 41 y.o.   MRN: [id number]  HPI abd pain- pt was dx'd w/ epiploic appendagitis on CT scan in January.  Pt reports she continues to have intermittent LLQ pain.  Pt was recently hiking, biking and again developed significant pain.  She doesn't want to avoid activity based on pain.  Pt has hx of chronic constipation.  Currently taking 2 stool softeners BID, taking Benefiber, using Miralax prn 'but not very often'.  Pt is concerned that the abdominal pain may or may not be the epiploic appendagitis and may be related to her ovary or other intra-abominal process.  No N/V, diarrhea.   Review of Systems For ROS see HPI     Objective:   Physical Exam  Constitutional: She is oriented to person, place, and time. She appears well-developed and well-nourished. No distress.  HENT:  Head: Normocephalic and atraumatic.  Abdominal: Soft. Bowel sounds are normal. She exhibits no distension and no mass. There is tenderness (mild TTP over LLQ/pelvis- no rebound or guarding). There is no rebound and no guarding.  Neurological: She is alert and oriented to person, place, and time.  Skin: Skin is warm and dry.  Psychiatric: She has a normal mood and affect. Her behavior is normal. Thought content normal.  Vitals reviewed.         Assessment & Plan:

## 2016-10-21 NOTE — Assessment & Plan Note (Signed)
New.  Pt is not convinced that her pain is due to her epiploic appendagitis.  Is asking if it could be ovarian in nature- we discussed that it is a possibility.  Will get [name] to assess.  Pt is not interested in repeat CT scan at this time due to radiation.  Not interested in surgical referral.  Discussed that if [name] is normal, she will need GI referral for further assessment of pain.  For now, will start Miralax daily for bowel regularity to improve constipation which may be cause of her pain.  Reviewed supportive care and red flags that should prompt return.  Pt expressed understanding and is in agreement w/ plan.

## 2016-10-21 NOTE — Assessment & Plan Note (Signed)
New to provider.  Pt was dx'd in January based on results of CT scan.  At her f/u appt, she was feeling much better but since then, pt has been having intermittent LLQ pain.  She reports that she struggles w/ constipation and pain does worsen when she is constipated.  Reviewed that constipation alone can be quite painful.  She is not interested in a surgical referral at this time, not interested in repeat imaging due to increased radiation.

## 2016-10-21 NOTE — Progress Notes (Signed)
Pre visit review using our clinic review tool, if applicable. No additional management support is needed unless otherwise documented below in the visit note. 

## 2016-11-04 ENCOUNTER — Ambulatory Visit
Admission: RE | Admit: 2016-11-04 | Discharge: 2016-11-04 | Disposition: A | Discharge status: Home / Self Care | Payer: BLUE CROSS/BLUE SHIELD | Source: Ambulatory Visit | Attending: Family Medicine | Admitting: Family Medicine

## 2016-11-04 DIAGNOSIS — K529 Noninfective gastroenteritis and colitis, unspecified: Principal | ICD-10-CM

## 2016-11-04 DIAGNOSIS — R1032 Left lower quadrant pain: Secondary | ICD-10-CM | POA: Diagnosis not present

## 2016-11-04 DIAGNOSIS — K6389 Other specified diseases of intestine: Secondary | ICD-10-CM

## 2016-11-06 ENCOUNTER — Encounter: Payer: Self-pay | Admitting: Family Medicine

## 2016-11-07 ENCOUNTER — Other Ambulatory Visit: Payer: Self-pay | Admitting: Family Medicine

## 2016-11-07 DIAGNOSIS — K6389 Other specified diseases of intestine: Secondary | ICD-10-CM

## 2016-11-07 DIAGNOSIS — K529 Noninfective gastroenteritis and colitis, unspecified: Principal | ICD-10-CM

## 2016-11-07 DIAGNOSIS — R1032 Left lower quadrant pain: Secondary | ICD-10-CM

## 2016-11-09 ENCOUNTER — Telehealth: Payer: Self-pay | Admitting: *Deleted

## 2016-11-09 ENCOUNTER — Encounter: Payer: Self-pay | Admitting: Gastroenterology

## 2016-11-09 NOTE — Telephone Encounter (Signed)
We typically refer to [facility] and I have confidence in all of the providers there.  GI usually reaches out to the patient to schedule so she does not have to call

## 2016-11-09 NOTE — Telephone Encounter (Signed)
Patient notified of PCP recommendations and is agreement and expresses an understanding.  

## 2016-11-09 NOTE — Telephone Encounter (Signed)
Patient called and asked about referral to gastro. She was going to call them for her appointment, but wanted to know who Dr. Birdie Riddle preferred.  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

## 2016-11-22 ENCOUNTER — Ambulatory Visit (INDEPENDENT_AMBULATORY_CARE_PROVIDER_SITE_OTHER): Payer: BLUE CROSS/BLUE SHIELD | Admitting: Podiatry

## 2016-11-22 ENCOUNTER — Encounter: Payer: Self-pay | Admitting: Podiatry

## 2016-11-22 ENCOUNTER — Ambulatory Visit (INDEPENDENT_AMBULATORY_CARE_PROVIDER_SITE_OTHER): Payer: BLUE CROSS/BLUE SHIELD

## 2016-11-22 DIAGNOSIS — M779 Enthesopathy, unspecified: Secondary | ICD-10-CM

## 2016-11-22 DIAGNOSIS — M79672 Pain in left foot: Secondary | ICD-10-CM | POA: Diagnosis not present

## 2016-11-22 DIAGNOSIS — M79671 Pain in right foot: Secondary | ICD-10-CM | POA: Diagnosis not present

## 2016-11-22 NOTE — Progress Notes (Signed)
   Subjective:    Patient ID: Victoria Burch, female    DOB: Jul 30, 1975, 41 y.o.   MRN: [id number]  HPI: She presents today with a chief concern of painful forefoot bilaterally. She states this been aching for years literally and seems to be getting worse. She states it hurts with her husband runs her feet. She's got a funny sensation to the second and third toes radiates from the first toe. She states that she fell about 2 years ago and hurt her left ankle and it occasionally is bothersome. 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 She states that she's been sitting like this for many years and wonders whether or not this could be part of her problem.    Review of Systems  All other systems reviewed and are negative.      Objective:   Physical Exam: Vital signs are stable she is alert and oriented 3 pulses are strongly palpable. Neurologic sensorium is intact. Deep tendon reflexes are intact. Muscle strength is 5 over 5 dorsiflexion plantar flexors and inverters everters all digits of musculature is intact. Orthopedic evaluation and x-rays all joints distal to the ankle for range of motion without crepitation. She has pain on range of motion and on palpation of the first metatarsophalangeal joint. She has mild tenderness on palpation of the third interdigital space bilaterally. She has minimal reproducible pain on palpation of the ankle range of motion of the ankle dorsiflexion plantar flexion inversion eversion. Radiographs do not demonstrate any type of osseus abnormalities either foot or ankle.        Assessment & Plan:  Assessment: Capsulitis first metatarsophalangeal joints. 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 Ankle sprain that resulted in possible tear of the ATFL left.  Plan: Dexamethasone injections first metatarsophalangeal joint  today after Betadine skin prep. Will follow up with her in 1 month. Discussed appropriate shoe gear stretching excising ice therapy.

## 2016-12-20 DIAGNOSIS — F429 Obsessive-compulsive disorder, unspecified: Secondary | ICD-10-CM | POA: Diagnosis not present

## 2016-12-20 DIAGNOSIS — F419 Anxiety disorder, unspecified: Secondary | ICD-10-CM | POA: Diagnosis not present

## 2016-12-21 ENCOUNTER — Ambulatory Visit (INDEPENDENT_AMBULATORY_CARE_PROVIDER_SITE_OTHER): Payer: BLUE CROSS/BLUE SHIELD | Admitting: Gastroenterology

## 2016-12-21 ENCOUNTER — Encounter: Payer: Self-pay | Admitting: Gastroenterology

## 2016-12-21 VITALS — BP 100/64 | HR 80 | Ht 61.12 in | Wt 154.4 lb

## 2016-12-21 DIAGNOSIS — K5900 Constipation, unspecified: Secondary | ICD-10-CM

## 2016-12-21 DIAGNOSIS — K921 Melena: Secondary | ICD-10-CM | POA: Diagnosis not present

## 2016-12-21 DIAGNOSIS — R1032 Left lower quadrant pain: Secondary | ICD-10-CM | POA: Diagnosis not present

## 2016-12-21 MED ORDER — DICYCLOMINE HCL 10 MG PO CAPS: 10.0000 mg | ORAL_CAPSULE | Freq: Three times a day (TID) | ORAL | 11 refills | Status: DC | PRN

## 2016-12-21 MED ORDER — SUPREP BOWEL PREP KIT 17.5-3.13-1.6 GM/177ML PO SOLN: ORAL | 0 refills | Status: DC

## 2016-12-21 NOTE — Patient Instructions (Addendum)
You have been scheduled for a colonoscopy. Please follow written instructions given to you at your visit today.  Please pick up your prep supplies at the pharmacy within the next 1-3 days. If you use inhalers (even only as needed), please bring them with you on the day of your procedure. Your physician has requested that you go to www.startemmi.com and enter the access code given to you at your visit today. This web site gives a general overview about your procedure. However, you should still follow specific instructions given to you by our office regarding your preparation for the procedure.  We have sent the following medications to your pharmacy for you to pick up at your convenience:   Dicyclomine 10mg , Take three times a day, as needed  Normal BMI (Body Mass Index- based on height and weight) is between 19 and 25. Your BMI today is Body mass index is 29.05 kg/m. Marland Kitchen Please consider follow up  regarding your BMI with your Primary Care Provider.  Thank you for choosing me and [facility].  Pricilla Riffle. Dagoberto Ligas., MD., Marval Regal

## 2016-12-21 NOTE — Progress Notes (Addendum)
History of Present Illness: This is a 41 year old female referred by Midge Minium, MD for the evaluation of LLQ abdominal pain, constipation. Patient has had problems with constipation for many years. She relates left lower quadrant pain that worsens with constipation and worsens with activity such as walking or movement. She occasionally notes the pain radiates around her left flank into her left lower back. She relates problems with SI joints for several years. In addition she underwent abdominal hysterectomy several years ago and following her hysterectomy she had herpes zoster eruption in the dermatome in her left lower quadrant of her abdomen near her hysterectomy incision. She relates occasional anal itching and small amounts of rectal bleeding which she has attributed to hemorrhoids. She is currently using MiraLAX for management of constipation which has been effective. Denies weight loss, diarrhea, change in stool caliber, melena, nausea, vomiting, dysphagia, reflux symptoms, chest pain.   Abd/pelvic CT 06/2016 IMPRESSION: Mild stranding adjacent to the distal descending colon, favor epiploic appendagitis.  Pelvic US 10/2016 IMPRESSION: 1. No acute findings. No findings to account for left lower quadrant pain. 2. Status post hysterectomy.  Transvag US 10/2016 IMPRESSION: 1. No acute findings. No findings to account for left lower quadrant pain. 2. Status post hysterectomy.   Allergies  Allergen Reactions  . Adhesive [Tape] Itching and Rash  . Penicillins Other (See Comments)    Some stomach upset   Outpatient Medications Prior to Visit  Medication Sig Dispense Refill  . loratadine (CLARITIN) 10 MG tablet Take 10 mg by mouth daily.    . busPIRone (BUSPAR) 10 MG tablet TK 1/2 T PO BID  2   No facility-administered medications prior to visit.    Past Medical History:  Diagnosis Date  . Adjustment disorder with mixed anxiety and depressed mood   . Anxiety   . Atypical  squamous cells of undetermined significance on cytologic smear of cervix (ASC-US)   . History of headache    Past Surgical History:  Procedure Laterality Date  . ABDOMINAL HYSTERECTOMY     still have ovaries  . ENDOMETRIAL CRYOABLATION    . EYE MUSCLE SURGERY Bilateral    2 right, 4 left  . TONSILLECTOMY    . WISDOM TOOTH EXTRACTION     Social History   Social History  . Marital status: Married    Spouse name: N/A  . Number of children: 2  . Years of education: N/A   Occupational History  . dental hygenist    Social History Main Topics  . Smoking status: Never Smoker  . Smokeless tobacco: Never Used  . Alcohol use Yes     Comment: < 1 per day  . Drug use: No  . Sexual activity: Not Asked   Other Topics Concern  . None   Social History Narrative  . None   Family History  Problem Relation Age of Onset  . Alcohol abuse Father   . Arthritis Father   . Mental illness Father   . Arthritis Maternal Grandmother   . Hypertension Maternal Grandmother   . Arthritis Maternal Grandfather   . Hypertension Maternal Grandfather   . Prostate cancer Maternal Grandfather        or testicular  . Skin cancer Maternal Grandfather   . Arthritis Paternal Grandmother   . Mental illness Paternal Grandmother   . Alcohol abuse Paternal Grandfather   . Arthritis Paternal Grandfather   . Lung cancer Paternal Grandfather  smoker  . Skin cancer Paternal Grandfather        mets lung, brain      Review of Systems: Pertinent positive and negative review of systems were noted in the above HPI section. All other review of systems were otherwise negative.   Physical Exam: General: Well developed, well nourished, no acute distress Head: Normocephalic and atraumatic Eyes:  sclerae anicteric, EOMI Ears: Normal auditory acuity Mouth: No deformity or lesions Neck: Supple, no masses or thyromegaly Lungs: Clear throughout to auscultation Heart: Regular rate and rhythm; no murmurs,  rubs or bruits Abdomen: Soft, minimal LLQ tenderness and non distended. No masses, hepatosplenomegaly or hernias noted. Normal Bowel sounds Rectal: deferred to colonoscopy Musculoskeletal: Symmetrical with no gross deformities  Skin: No lesions on visible extremities Pulses:  Normal pulses noted Extremities: No clubbing, cyanosis, edema or deformities noted Neurological: Alert oriented x 4, grossly nonfocal Cervical Nodes:  No significant cervical adenopathy Inguinal Nodes: No significant inguinal adenopathy Psychological:  Alert and cooperative. Normal mood and affect  Assessment and Recommendations:  1. Left lower quadrant pain, primarily has musculoskeletal features however some overlap with constipation symptoms. Epiploic appendagitis has resolved. Rule out neuropathic pain related to prior herpes zoster, radicular pain, referred pain from her lower back or hip, painful adhesions following hysterectomy. I feel that this is not likely to be a colorectal or other GI disorder however given the chronicity of her complaints, occasional hematochezia (likely hemorrhoidal) and chronic constipation will proceed with colonoscopy for further evaluation. If this does not yield a GI diagnosis she is advised to return to her PCP for further management. Continue MiraLAX once or twice daily titrated for complete adequate daily bowel movements. Trial of dicyclomine 10 mg 3 times a day when necessary for LLQ pain.   cc: Midge Minium, MD [name] [place], [place] [address]

## 2016-12-23 ENCOUNTER — Telehealth: Payer: Self-pay | Admitting: Gastroenterology

## 2016-12-23 NOTE — Telephone Encounter (Signed)
Informed patient that I can mail her a coupon to pay no more than 50 dollars on the Suprep. Patient verbalized understanding.

## 2017-01-10 ENCOUNTER — Ambulatory Visit: Payer: BLUE CROSS/BLUE SHIELD | Admitting: Podiatry

## 2017-01-20 ENCOUNTER — Encounter: Payer: Self-pay | Admitting: Family Medicine

## 2017-01-20 ENCOUNTER — Ambulatory Visit (INDEPENDENT_AMBULATORY_CARE_PROVIDER_SITE_OTHER): Payer: BLUE CROSS/BLUE SHIELD | Admitting: Family Medicine

## 2017-01-20 ENCOUNTER — Encounter: Payer: BLUE CROSS/BLUE SHIELD | Admitting: Family Medicine

## 2017-01-20 VITALS — BP 101/60 | HR 92 | Temp 98.1°F | Resp 16 | Ht 61.0 in | Wt 152.5 lb

## 2017-01-20 DIAGNOSIS — E663 Overweight: Secondary | ICD-10-CM | POA: Diagnosis not present

## 2017-01-20 DIAGNOSIS — E559 Vitamin D deficiency, unspecified: Secondary | ICD-10-CM | POA: Diagnosis not present

## 2017-01-20 DIAGNOSIS — E669 Obesity, unspecified: Secondary | ICD-10-CM | POA: Insufficient documentation

## 2017-01-20 DIAGNOSIS — Z0000 Encounter for general adult medical examination without abnormal findings: Secondary | ICD-10-CM | POA: Diagnosis not present

## 2017-01-20 LAB — LIPID PANEL
CHOLESTEROL: 148 mg/dL (ref 0–200)
HDL: 58.4 mg/dL (ref >39.00)
LDL CALC: 75 mg/dL (ref 0–99)
NonHDL: 89.84
TRIGLYCERIDES: 73 mg/dL (ref 0.0–149.0)
Total CHOL/HDL Ratio: 3
VLDL: 14.6 mg/dL (ref 0.0–40.0)

## 2017-01-20 LAB — BASIC METABOLIC PANEL
BUN: 13 mg/dL (ref 6–23)
CALCIUM: 9.6 mg/dL (ref 8.4–10.5)
CO2: 25 meq/L (ref 19–32)
Chloride: 103 mEq/L (ref 96–112)
Creatinine, Ser: 0.72 mg/dL (ref 0.40–1.20)
GFR: 95.77 mL/min (ref >60.00)
GLUCOSE: 84 mg/dL (ref 70–99)
Potassium: 3.6 mEq/L (ref 3.5–5.1)
Sodium: 137 mEq/L (ref 135–145)

## 2017-01-20 LAB — CBC WITH DIFFERENTIAL/PLATELET
BASOS ABS: 0 10*3/uL (ref 0.0–0.1)
Basophils Relative: 0.8 % (ref 0.0–3.0)
EOS ABS: 0.1 10*3/uL (ref 0.0–0.7)
Eosinophils Relative: 1.8 % (ref 0.0–5.0)
HCT: 40 % (ref 36.0–46.0)
HEMOGLOBIN: 13.8 g/dL (ref 12.0–15.0)
LYMPHS ABS: 1.4 10*3/uL (ref 0.7–4.0)
LYMPHS PCT: 29.9 % (ref 12.0–46.0)
MCHC: 34.4 g/dL (ref 30.0–36.0)
MCV: 88.3 fl (ref 78.0–100.0)
MONOS PCT: 7.6 % (ref 3.0–12.0)
Monocytes Absolute: 0.4 10*3/uL (ref 0.1–1.0)
Neutro Abs: 2.8 10*3/uL (ref 1.4–7.7)
Neutrophils Relative %: 59.9 % (ref 43.0–77.0)
Platelets: 176 10*3/uL (ref 150.0–400.0)
RBC: 4.53 Mil/uL (ref 3.87–5.11)
RDW: 12.9 % (ref 11.5–15.5)
WBC: 4.6 10*3/uL (ref 4.0–10.5)

## 2017-01-20 LAB — HEPATIC FUNCTION PANEL
ALBUMIN: 4.7 g/dL (ref 3.5–5.2)
ALK PHOS: 50 U/L (ref 39–117)
ALT: 15 U/L (ref 0–35)
AST: 16 U/L (ref 0–37)
Bilirubin, Direct: 0.1 mg/dL (ref 0.0–0.3)
TOTAL PROTEIN: 7 g/dL (ref 6.0–8.3)
Total Bilirubin: 0.8 mg/dL (ref 0.2–1.2)

## 2017-01-20 LAB — VITAMIN D 25 HYDROXY (VIT D DEFICIENCY, FRACTURES): VITD: 29.18 ng/mL — ABNORMAL LOW (ref 30.00–100.00)

## 2017-01-20 LAB — TSH: TSH: 1.27 u[IU]/mL (ref 0.35–4.50)

## 2017-01-20 NOTE — Patient Instructions (Signed)
Follow up in 1 year or as needed We'll notify you of your lab results and make any changes if needed Continue to work on healthy diet and regular exercise- you look great! Call with any questions or concerns Enjoy the rest of your summer!!! 

## 2017-01-20 NOTE — Progress Notes (Signed)
Pre visit review using our clinic review tool, if applicable. No additional management support is needed unless otherwise documented below in the visit note. 

## 2017-01-20 NOTE — Progress Notes (Signed)
   Subjective:    Patient ID: Victoria Burch, female    DOB: 12-Jul-1975, 41 y.o.   MRN: [id number]  HPI CPE- UTD on mammo, no need for pap due to hysterectomy.     Review of Systems Patient reports no vision/ hearing changes, adenopathy,fever, weight change,  persistant/recurrent hoarseness , swallowing issues, chest pain, palpitations, edema, persistant/recurrent cough, hemoptysis, dyspnea (rest/exertional/paroxysmal nocturnal), gastrointestinal bleeding (melena, rectal bleeding), abdominal pain, significant heartburn, bowel changes, GU symptoms (dysuria, hematuria, incontinence), Gyn symptoms (abnormal  bleeding, pain),  syncope, focal weakness, memory loss, numbness & tingling, skin/hair/nail changes, abnormal bruising or bleeding, anxiety, or depression.     Objective:   Physical Exam General Appearance:    Alert, cooperative, no distress, appears stated age  Head:    Normocephalic, without obvious abnormality, atraumatic  Eyes:    PERRL, conjunctiva/corneas clear, EOM's intact, fundi    benign, both eyes  Ears:    Normal TM's and external ear canals, both ears  Nose:   Nares normal, septum midline, mucosa normal, no drainage    or sinus tenderness  Throat:   Lips, mucosa, and tongue normal; teeth and gums normal  Neck:   Supple, symmetrical, trachea midline, no adenopathy;    Thyroid: no enlargement/tenderness/nodules  Back:     Symmetric, no curvature, ROM normal, no CVA tenderness  Lungs:     Clear to auscultation bilaterally, respirations unlabored  Chest Wall:    No tenderness or deformity   Heart:    Regular rate and rhythm, S1 and S2 normal, no murmur, rub   or gallop  Breast Exam:    Deferred to GYN  Abdomen:     Soft, non-tender, bowel sounds active all four quadrants,    no masses, no organomegaly  Genitalia:    Deferred to GYN  Rectal:    Extremities:   Extremities normal, atraumatic, no cyanosis or edema  Pulses:   2+ and symmetric all extremities  Skin:   Skin  color, texture, turgor normal, no rashes or lesions  Lymph nodes:   Cervical, supraclavicular, and axillary nodes normal  Neurologic:   CNII-XII intact, normal strength, sensation and reflexes    throughout          Assessment & Plan:

## 2017-01-20 NOTE — Assessment & Plan Note (Signed)
Pt's PE WNL.  Pt has GYN exam scheduled and colonoscopy pending.  Check labs.  Anticipatory guidance provided.

## 2017-01-23 ENCOUNTER — Encounter: Payer: Self-pay | Admitting: General Practice

## 2017-02-02 ENCOUNTER — Encounter: Payer: Self-pay | Admitting: Gastroenterology

## 2017-02-09 ENCOUNTER — Telehealth: Payer: Self-pay | Admitting: Gastroenterology

## 2017-02-09 NOTE — Telephone Encounter (Signed)
Spoke to [employer], let her know to last dose of Miralax on 8/21. She will start her prep on 8/22 and procedure is 8/23.

## 2017-02-14 ENCOUNTER — Ambulatory Visit (INDEPENDENT_AMBULATORY_CARE_PROVIDER_SITE_OTHER): Payer: BLUE CROSS/BLUE SHIELD | Admitting: Podiatry

## 2017-02-14 ENCOUNTER — Encounter: Payer: Self-pay | Admitting: Podiatry

## 2017-02-14 DIAGNOSIS — M779 Enthesopathy, unspecified: Secondary | ICD-10-CM

## 2017-02-14 DIAGNOSIS — M205X9 Other deformities of toe(s) (acquired), unspecified foot: Secondary | ICD-10-CM

## 2017-02-14 DIAGNOSIS — M7750 Other enthesopathy of unspecified foot: Secondary | ICD-10-CM

## 2017-02-14 MED ORDER — MELOXICAM 15 MG PO TABS: 15.0000 mg | ORAL_TABLET | Freq: Every day | ORAL | 3 refills | Status: DC

## 2017-02-15 NOTE — Progress Notes (Signed)
She presents today for follow-up on her painful first metatarsophalangeal joints bilaterally. She states that she may notice some difference since she's been trying not to sit with her toes maximally extended but she states that the injections did not help at all.  Objective: Vital signs are stable she is alert and oriented 3. Pulses are palpable. She still has tenderness on palpation of the joint itself bilaterally right greater than left is minimal edema no cellulitis drainage or odor no crepitation. No snapping or popping or grinding on range of motion. She does have hypermobility of the first metatarsal and a very odd shaped head on radiograph. I think the combination of this hypermobility and hyperextension is causing her soreness.  Assessment: Hypermobile first ray bilateral compensatory lateral pain. 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  Plan: She was scanned for orthotics today to limit the hypermobility of the foot. I also wrote her prescription for meloxicam 15 mg 1 by mouth daily. Follow-up with her in 1 month.

## 2017-02-16 ENCOUNTER — Encounter: Payer: Self-pay | Admitting: Gastroenterology

## 2017-02-16 ENCOUNTER — Ambulatory Visit (AMBULATORY_SURGERY_CENTER): Payer: BLUE CROSS/BLUE SHIELD | Admitting: Gastroenterology

## 2017-02-16 VITALS — BP 119/76 | HR 81 | Temp 98.7°F | Resp 15 | Ht 61.0 in | Wt 154.0 lb

## 2017-02-16 DIAGNOSIS — R1032 Left lower quadrant pain: Secondary | ICD-10-CM | POA: Diagnosis not present

## 2017-02-16 DIAGNOSIS — K921 Melena: Secondary | ICD-10-CM

## 2017-02-16 MED ORDER — SODIUM CHLORIDE 0.9 % IV SOLN: 500.0000 mL | INTRAVENOUS | Status: DC

## 2017-02-16 NOTE — Progress Notes (Signed)
Report to PACU, RN, vss, BBS= Clear.  

## 2017-02-16 NOTE — Progress Notes (Signed)
Pt's states no medical or surgical changes since previsit or office visit. 

## 2017-02-16 NOTE — Patient Instructions (Signed)
YOU HAD AN ENDOSCOPIC PROCEDURE TODAY AT THE [facility]:   Refer to the procedure report that was given to you for any specific questions about what was found during the examination.  If the procedure report does not answer your questions, please call your gastroenterologist to clarify.  If you requested that your care partner not be given the details of your procedure findings, then the procedure report has been included in a sealed envelope for you to review at your convenience later.  YOU SHOULD EXPECT: Some feelings of bloating in the abdomen. Passage of more gas than usual.  Walking can help get rid of the air that was put into your GI tract during the procedure and reduce the bloating. If you had a lower endoscopy (such as a colonoscopy or flexible sigmoidoscopy) you may notice spotting of blood in your stool or on the toilet paper. If you underwent a bowel prep for your procedure, you may not have a normal bowel movement for a few days.  Please Note:  You might notice some irritation and congestion in your nose or some drainage.  This is from the oxygen used during your procedure.  There is no need for concern and it should clear up in a day or so.  SYMPTOMS TO REPORT IMMEDIATELY:   Following lower endoscopy (colonoscopy or flexible sigmoidoscopy):  Excessive amounts of blood in the stool  Significant tenderness or worsening of abdominal pains  Swelling of the abdomen that is new, acute  Fever of 100F or higher   For urgent or emergent issues, a gastroenterologist can be reached at any hour by calling [phone].   DIET:  We do recommend a small meal at first, but then you may proceed to your regular diet.  Drink plenty of fluids but you should avoid alcoholic beverages for 24 hours.  ACTIVITY:  You should plan to take it easy for the rest of today and you should NOT DRIVE or use heavy machinery until tomorrow (because of the sedation medicines used during the test).     FOLLOW UP: Our staff will call the number listed on your records the next business day following your procedure to check on you and address any questions or concerns that you may have regarding the information given to you following your procedure. If we do not reach you, we will leave a message.  However, if you are feeling well and you are not experiencing any problems, there is no need to return our call.  We will assume that you have returned to your regular daily activities without incident.  If any biopsies were taken you will be contacted by phone or by letter within the next 1-3 weeks.  Please call us at [phone] if you have not heard about the biopsies in 3 weeks.    SIGNATURES/CONFIDENTIALITY: You and/or your care partner have signed paperwork which will be entered into your electronic medical record.  These signatures attest to the fact that that the information above on your After Visit Summary has been reviewed and is understood.  Full responsibility of the confidentiality of this discharge information lies with you and/or your care-partner.  Thank you for letting us take care of your healthcare needs today. 

## 2017-02-16 NOTE — Op Note (Addendum)
[facility] Patient Name: Victoria Burch Procedure Date: 02/16/2017 2:42 PM MRN: [id number] Endoscopist: Ladene Artist , MD Age: 41 Referring MD:  Date of Birth: 30-Sep-1975 Gender: Female Account #: [id number] Procedure:                Colonoscopy Indications:              Abdominal pain in the left lower quadrant,                            Hematochezia, Constipation Medicines:                Monitored Anesthesia Care Procedure:                Pre-Anesthesia Assessment:                           - Prior to the procedure, a History and Physical                            was performed, and patient medications and                            allergies were reviewed. The patient's tolerance of                            previous anesthesia was also reviewed. The risks                            and benefits of the procedure and the sedation                            options and risks were discussed with the patient.                            All questions were answered, and informed consent                            was obtained. Prior Anticoagulants: The patient has                            taken no previous anticoagulant or antiplatelet                            agents. ASA Grade Assessment: II - A patient with                            mild systemic disease. After reviewing the risks                            and benefits, the patient was deemed in                            satisfactory condition to undergo the procedure.  After obtaining informed consent, the colonoscope                            was passed under direct vision. Throughout the                            procedure, the patient's blood pressure, pulse, and                            oxygen saturations were monitored continuously. The                            Colonoscope was introduced through the anus and                            advanced to the the cecum, identified by                             appendiceal orifice and ileocecal valve. The                            ileocecal valve, appendiceal orifice, and rectum                            were photographed. The quality of the bowel                            preparation was excellent. The colonoscopy was                            performed without difficulty. The patient tolerated                            the procedure well. Scope In: 2:50:22 PM Scope Out: 3:02:02 PM Scope Withdrawal Time: 0 hours 8 minutes 40 seconds  Total Procedure Duration: 0 hours 11 minutes 40 seconds  Findings:                 The perianal and digital rectal examinations were                            normal.                           Internal hemorrhoids were found during                            retroflexion. The hemorrhoids were small and Grade                            I (internal hemorrhoids that do not prolapse).                           The exam was otherwise without abnormality on  direct and retroflexion views. Complications:            No immediate complications. Estimated blood loss:                            None. Estimated Blood Loss:     Estimated blood loss: none. Impression:               - Internal hemorrhoids.                           - The examination was otherwise normal on direct                            and retroflexion views.                           - No specimens collected. Recommendation:           - Repeat colonoscopy at age 29 for screening                            purposes.                           - Patient has a contact number available for                            emergencies. The signs and symptoms of potential                            delayed complications were discussed with the                            patient. Return to normal activities tomorrow.                            Written discharge instructions were provided to the                             patient.                           - Resume previous diet.                           - Continue present medications.                           - No GI cause for LLQ pain was found.                           - Return to primary care physician for ongoing care                            at appointment to be scheduled. Ladene Artist, MD 02/16/2017 3:05:48 PM This report has been signed electronically.

## 2017-02-17 ENCOUNTER — Telehealth: Payer: Self-pay | Admitting: *Deleted

## 2017-02-17 NOTE — Telephone Encounter (Signed)
  Follow up Call-  Call back number 02/16/2017  Post procedure Call Back phone  # [phone]  Permission to leave phone message Yes  Some recent data might be hidden     Patient questions:  Do you have a fever, pain , or abdominal swelling? No. Pain Score  0 *  Have you tolerated food without any problems? Yes.    Have you been able to return to your normal activities? Yes.    Do you have any questions about your discharge instructions: Diet   No. Medications  No. Follow up visit  No.  Do you have questions or concerns about your Care? No.  Actions: * If pain score is 4 or above: No action needed, pain <4.

## 2017-02-24 DIAGNOSIS — Z1389 Encounter for screening for other disorder: Secondary | ICD-10-CM | POA: Diagnosis not present

## 2017-02-24 DIAGNOSIS — Z6828 Body mass index (BMI) 28.0-28.9, adult: Secondary | ICD-10-CM | POA: Diagnosis not present

## 2017-02-24 DIAGNOSIS — Z01419 Encounter for gynecological examination (general) (routine) without abnormal findings: Secondary | ICD-10-CM | POA: Diagnosis not present

## 2017-02-24 DIAGNOSIS — K649 Unspecified hemorrhoids: Secondary | ICD-10-CM | POA: Diagnosis not present

## 2017-02-24 DIAGNOSIS — N898 Other specified noninflammatory disorders of vagina: Secondary | ICD-10-CM | POA: Diagnosis not present

## 2017-03-07 ENCOUNTER — Encounter: Payer: BLUE CROSS/BLUE SHIELD | Admitting: Orthotics

## 2017-03-14 ENCOUNTER — Ambulatory Visit (INDEPENDENT_AMBULATORY_CARE_PROVIDER_SITE_OTHER): Payer: BLUE CROSS/BLUE SHIELD | Admitting: Orthotics

## 2017-03-14 DIAGNOSIS — M779 Enthesopathy, unspecified: Secondary | ICD-10-CM

## 2017-03-14 DIAGNOSIS — M205X9 Other deformities of toe(s) (acquired), unspecified foot: Secondary | ICD-10-CM

## 2017-03-14 NOTE — Progress Notes (Signed)
Patient came in today to pick up custom made foot orthotics.  The goals were accomplished and the patient reported no dissatisfaction with said orthotics.  Patient was advised of breakin period and how to report any issues. 

## 2017-03-27 ENCOUNTER — Ambulatory Visit: Payer: BLUE CROSS/BLUE SHIELD | Admitting: Orthotics

## 2017-03-27 DIAGNOSIS — M205X9 Other deformities of toe(s) (acquired), unspecified foot: Secondary | ICD-10-CM

## 2017-03-27 NOTE — Progress Notes (Signed)
Patient returned F/O and wanted adjustments, valgus wedge not enough for significant hallux limitus/hypermobility pain.  Sent back to [facility]y to be remade w/ soft morton's extension, navicular notch to be cut.

## 2017-04-18 ENCOUNTER — Ambulatory Visit: Payer: BLUE CROSS/BLUE SHIELD | Admitting: Orthotics

## 2017-04-18 DIAGNOSIS — M205X9 Other deformities of toe(s) (acquired), unspecified foot: Secondary | ICD-10-CM

## 2017-04-18 DIAGNOSIS — M779 Enthesopathy, unspecified: Secondary | ICD-10-CM

## 2017-04-18 NOTE — Progress Notes (Signed)
Patient p/up foot orthotics that were adjusted:  Navicular cut out and soft morton's extension added.

## 2017-04-26 ENCOUNTER — Other Ambulatory Visit (HOSPITAL_COMMUNITY)
Admission: RE | Admit: 2017-04-26 | Discharge: 2017-04-26 | Disposition: A | Discharge status: Home / Self Care | Payer: BLUE CROSS/BLUE SHIELD | Source: Ambulatory Visit | Attending: Physician Assistant | Admitting: Physician Assistant

## 2017-04-26 ENCOUNTER — Ambulatory Visit (INDEPENDENT_AMBULATORY_CARE_PROVIDER_SITE_OTHER): Payer: BLUE CROSS/BLUE SHIELD | Admitting: Physician Assistant

## 2017-04-26 ENCOUNTER — Encounter: Payer: Self-pay | Admitting: Physician Assistant

## 2017-04-26 ENCOUNTER — Telehealth: Payer: Self-pay | Admitting: Family Medicine

## 2017-04-26 VITALS — BP 110/64 | HR 112 | Temp 98.2°F | Resp 14 | Ht 61.0 in | Wt 156.0 lb

## 2017-04-26 DIAGNOSIS — B373 Candidiasis of vulva and vagina: Secondary | ICD-10-CM | POA: Insufficient documentation

## 2017-04-26 DIAGNOSIS — B3731 Acute candidiasis of vulva and vagina: Secondary | ICD-10-CM

## 2017-04-26 MED ORDER — FLUCONAZOLE 150 MG PO TABS: 150.0000 mg | ORAL_TABLET | Freq: Once | ORAL | 0 refills | Status: AC

## 2017-04-26 NOTE — Telephone Encounter (Signed)
This should be an appt correct?

## 2017-04-26 NOTE — Patient Instructions (Signed)
Please take medication as directed. Continue hydration and probiotic. Make sure to limit scented or dyed products used on these sensitive areas.   I will call you with your results.  We will alter regimen if symptoms are not resolving.   Vaginal Yeast infection, Adult Vaginal yeast infection is a condition that causes soreness, swelling, and redness (inflammation) of the vagina. It also causes vaginal discharge. This is a common condition. Some women get this infection frequently. What are the causes? This condition is caused by a change in the normal balance of the yeast (candida) and bacteria that live in the vagina. This change causes an overgrowth of yeast, which causes the inflammation. What increases the risk? This condition is more likely to develop in:  Women who take antibiotic medicines.  Women who have diabetes.  Women who take birth control pills.  Women who are pregnant.  Women who douche often.  Women who have a weak defense (immune) system.  Women who have been taking steroid medicines for a long time.  Women who frequently wear tight clothing.  What are the signs or symptoms? Symptoms of this condition include:  White, thick vaginal discharge.  Swelling, itching, redness, and irritation of the vagina. The lips of the vagina (vulva) may be affected as well.  Pain or a burning feeling while urinating.  Pain during sex.  How is this diagnosed? This condition is diagnosed with a medical history and physical exam. This will include a pelvic exam. Your health care provider will examine a sample of your vaginal discharge under a microscope. Your health care provider may send this sample for testing to confirm the diagnosis. How is this treated? This condition is treated with medicine. Medicines may be over-the-counter or prescription. You may be told to use one or more of the following:  Medicine that is taken orally.  Medicine that is applied as a  cream.  Medicine that is inserted directly into the vagina (suppository).  Follow these instructions at home:  Take or apply over-the-counter and prescription medicines only as told by your health care provider.  Do not have sex until your health care provider has approved. Tell your sex partner that you have a yeast infection. That person should go to his or her health care provider if he or she develops symptoms.  Do not wear tight clothes, such as pantyhose or tight pants.  Avoid using tampons until your health care provider approves.  Eat more yogurt. This may help to keep your yeast infection from returning.  Try taking a sitz bath to help with discomfort. This is a warm water bath that is taken while you are sitting down. The water should only come up to your hips and should cover your buttocks. Do this 3-4 times per day or as told by your health care provider.  Do not douche.  Wear breathable, cotton underwear.  If you have diabetes, keep your blood sugar levels under control. Contact a health care provider if:  You have a fever.  Your symptoms go away and then return.  Your symptoms do not get better with treatment.  Your symptoms get worse.  You have new symptoms.  You develop blisters in or around your vagina.  You have blood coming from your vagina and it is not your menstrual period.  You develop pain in your abdomen. This information is not intended to replace advice given to you by your health care provider. Make sure you discuss any questions you have  with your health care provider. Document Released: 03/23/2005 Document Revised: 11/25/2015 Document Reviewed: 12/15/2014 Elsevier Interactive Patient Education  2018 [employer].

## 2017-04-26 NOTE — Telephone Encounter (Signed)
If not able to treat w/ OTC meds, we need to an appt to make sure we are treating appropriately

## 2017-04-26 NOTE — Progress Notes (Signed)
Pre visit review using our clinic review tool, if applicable. No additional management support is needed unless otherwise documented below in the visit note. 

## 2017-04-26 NOTE — Telephone Encounter (Signed)
Pt states that she has a yeast infection and asking for something to be called into [facility] in [place].

## 2017-04-26 NOTE — Telephone Encounter (Signed)
Called and spoke with pt, she is having active discharge. 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 Pt scheduled with Einar Pheasant this afternoon.

## 2017-04-26 NOTE — Progress Notes (Signed)
   Patient presents to clinic today c/o days of vaginal discharge that is thick and "cottage-cheese" like per patient. Associated with vaginal pruritus. Denies vaginal pain or lesion. Is sexually active with one female partner (husband). No concern for STI. Denies dysuria, urinary urgency, frequency or hematuria. Denies fever, chills, N/V, back pain or abdominal pain.  Past Medical History:  Diagnosis Date  . Adjustment disorder with mixed anxiety and depressed mood   . Anxiety   . Atypical squamous cells of undetermined significance on cytologic smear of cervix (ASC-US)   . History of headache     No current outpatient prescriptions on file prior to visit.   No current facility-administered medications on file prior to visit.     Allergies  Allergen Reactions  . Adhesive [Tape] Itching and Rash  . Penicillins Other (See Comments)    Some stomach upset    Family History  Problem Relation Age of Onset  . Alcohol abuse Father   . Arthritis Father   . Mental illness Father   . Arthritis Maternal Grandmother   . Hypertension Maternal Grandmother   . Arthritis Maternal Grandfather   . Hypertension Maternal Grandfather   . Prostate cancer Maternal Grandfather        or testicular  . Skin cancer Maternal Grandfather   . Arthritis Paternal Grandmother   . Mental illness Paternal Grandmother   . Alcohol abuse Paternal Grandfather   . Arthritis Paternal Grandfather   . Lung cancer Paternal Grandfather        smoker  . Skin cancer Paternal Grandfather        mets lung, brain    Social History   Social History  . Marital status: Married    Spouse name: N/A  . Number of children: 2  . Years of education: N/A   Occupational History  . dental hygenist    Social History Main Topics  . Smoking status: Never Smoker  . Smokeless tobacco: Never Used  . Alcohol use Yes     Comment: < 1 per day  . Drug use: No  . Sexual activity: Not Asked   Other Topics Concern  . None    Social History Narrative  . None   Review of Systems - See HPI.  All other ROS are negative.  BP 110/64   Pulse (!) 112   Temp 98.2 F (36.8 C) (Oral)   Resp 14   Ht 5\' 1"  (1.549 m)   Wt 156 lb (70.8 kg)   SpO2 99%   BMI 29.48 kg/m   Physical Exam  Constitutional: She is oriented to person, place, and time and well-developed, well-nourished, and in no distress.  HENT:  Head: Normocephalic and atraumatic.  Eyes: Conjunctivae are normal.  Neck: Neck supple.  Cardiovascular: Normal rate, regular rhythm, normal heart sounds and intact distal pulses.   Pulmonary/Chest: Effort normal and breath sounds normal. No respiratory distress. She has no wheezes. She has no rales. She exhibits no tenderness.  Abdominal: Soft. Bowel sounds are normal. She exhibits no distension. There is no tenderness.  Negative CVA tenderness.  Neurological: She is alert and oriented to person, place, and time.  Skin: Skin is warm and dry. No rash noted.  Vitals reviewed.  Assessment/Plan: 1. Yeast vaginitis Will empirically treat for yeast with Diflucan. Urine ancillary sent for further assessment. Supportive measures reviewed with patient.  - Urine cytology ancillary only   Leeanne Rio, PA-C

## 2017-05-01 ENCOUNTER — Telehealth: Payer: Self-pay | Admitting: Physician Assistant

## 2017-05-01 ENCOUNTER — Encounter: Payer: Self-pay | Admitting: Physician Assistant

## 2017-05-01 LAB — URINE CYTOLOGY ANCILLARY ONLY
BACTERIAL VAGINITIS: POSITIVE — AB
CANDIDA VAGINITIS: NEGATIVE

## 2017-05-01 MED ORDER — METRONIDAZOLE 0.75 % EX GEL: CUTANEOUS | 0 refills | Status: DC

## 2017-05-01 NOTE — Telephone Encounter (Signed)
Wet prep negative for yeast. Positive for bacterial vaginosis. Will start metrogel to treat. Rx sent to pharmacy.

## 2017-05-02 NOTE — Telephone Encounter (Signed)
Patient aware.

## 2017-05-08 MED ORDER — FLUCONAZOLE 150 MG PO TABS: 150.0000 mg | ORAL_TABLET | Freq: Once | ORAL | 0 refills | Status: AC

## 2017-06-12 ENCOUNTER — Ambulatory Visit: Payer: BLUE CROSS/BLUE SHIELD | Admitting: Orthotics

## 2017-06-12 DIAGNOSIS — M205X9 Other deformities of toe(s) (acquired), unspecified foot: Secondary | ICD-10-CM

## 2017-06-12 NOTE — Progress Notes (Signed)
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  Right only

## 2017-08-04 DIAGNOSIS — H10413 Chronic giant papillary conjunctivitis, bilateral: Secondary | ICD-10-CM | POA: Diagnosis not present

## 2017-08-23 ENCOUNTER — Encounter: Payer: Self-pay | Admitting: General Practice

## 2017-08-28 ENCOUNTER — Encounter: Payer: Self-pay | Admitting: Family Medicine

## 2017-08-30 ENCOUNTER — Ambulatory Visit: Payer: BLUE CROSS/BLUE SHIELD | Admitting: Family Medicine

## 2017-10-03 ENCOUNTER — Encounter: Payer: Self-pay | Admitting: Family Medicine

## 2017-11-17 ENCOUNTER — Telehealth: Payer: Self-pay | Admitting: Obstetrics & Gynecology

## 2017-12-07 ENCOUNTER — Encounter

## 2018-04-20 DIAGNOSIS — Z1231 Encounter for screening mammogram for malignant neoplasm of breast: Secondary | ICD-10-CM | POA: Diagnosis not present

## 2018-08-17 DIAGNOSIS — Z130 Encounter for screening for diseases of the blood and blood-forming organs and certain disorders involving the immune mechanism: Secondary | ICD-10-CM | POA: Diagnosis not present

## 2018-08-17 DIAGNOSIS — Z1389 Encounter for screening for other disorder: Secondary | ICD-10-CM | POA: Diagnosis not present

## 2018-08-17 DIAGNOSIS — Z6828 Body mass index (BMI) 28.0-28.9, adult: Secondary | ICD-10-CM | POA: Diagnosis not present

## 2018-08-17 DIAGNOSIS — Z01419 Encounter for gynecological examination (general) (routine) without abnormal findings: Secondary | ICD-10-CM | POA: Diagnosis not present

## 2018-12-04 ENCOUNTER — Ambulatory Visit: Payer: PRIVATE HEALTH INSURANCE | Admitting: Podiatry

## 2018-12-04 ENCOUNTER — Ambulatory Visit: Payer: BC Managed Care – PPO

## 2018-12-04 ENCOUNTER — Other Ambulatory Visit: Payer: Self-pay

## 2018-12-04 ENCOUNTER — Encounter: Payer: Self-pay | Admitting: Podiatry

## 2018-12-04 VITALS — BP 115/76 | HR 83 | Temp 97.7°F | Resp 16

## 2018-12-04 DIAGNOSIS — G5762 Lesion of plantar nerve, left lower limb: Secondary | ICD-10-CM

## 2018-12-04 DIAGNOSIS — M2041 Other hammer toe(s) (acquired), right foot: Secondary | ICD-10-CM

## 2018-12-04 DIAGNOSIS — G5761 Lesion of plantar nerve, right lower limb: Secondary | ICD-10-CM | POA: Diagnosis not present

## 2018-12-04 MED ORDER — GABAPENTIN 100 MG PO CAPS: 100.0000 mg | ORAL_CAPSULE | Freq: Every day | ORAL | 3 refills | Status: DC

## 2018-12-04 NOTE — Progress Notes (Signed)
She presents today stating that the last 2 weeks she feels like she is been standing on a rock or a lump with intermittent pain numbness and tenderness fourth and fifth toes bilaterally.  Denies any other issues.  She does state that she continues to have pain in the forefoot particularly at nighttime as she is trying to go to bed it bothers her.  Objective: Vital signs are stable she is alert and oriented x3 pulses are strongly palpable.  Neurologic sensorium is intact she has a mild palpable Mulder's click to the third and fourth interdigital spaces bilateral.  This could be consistent with early neuromas.  More than likely this is compensatory due to the pain in the first metatarsal phalangeal joint.  Assessment: Cannot rule out early neuritis neuropathy or neuroma.  Plan: At this point no x-rays were done no injections were performed I started her on gabapentin 100 mg at nighttime.  And I want her to bring her orthotics back to [place] the next time she comes in I would like a metatarsal pad to help splay the forefoot and address the pain that she has in the first metatarsal phalangeal joint with the orthotic.

## 2019-01-08 ENCOUNTER — Ambulatory Visit: Payer: PRIVATE HEALTH INSURANCE | Admitting: Podiatry

## 2019-02-19 ENCOUNTER — Ambulatory Visit: Payer: PRIVATE HEALTH INSURANCE | Admitting: Podiatry

## 2019-12-05 ENCOUNTER — Other Ambulatory Visit: Payer: Self-pay | Admitting: Podiatry

## 2019-12-26 ENCOUNTER — Encounter: Payer: Self-pay | Admitting: Family Medicine

## 2020-04-27 ENCOUNTER — Encounter: Payer: Self-pay | Admitting: Family Medicine

## 2020-09-28 ENCOUNTER — Other Ambulatory Visit: Payer: Self-pay | Admitting: Family Medicine

## 2020-09-28 DIAGNOSIS — Z1231 Encounter for screening mammogram for malignant neoplasm of breast: Secondary | ICD-10-CM

## 2020-11-10 ENCOUNTER — Ambulatory Visit
Admission: RE | Admit: 2020-11-10 | Discharge: 2020-11-10 | Disposition: A | Discharge status: Home / Self Care | Payer: PRIVATE HEALTH INSURANCE | Source: Ambulatory Visit

## 2020-11-10 ENCOUNTER — Other Ambulatory Visit: Payer: Self-pay

## 2020-11-10 DIAGNOSIS — Z1231 Encounter for screening mammogram for malignant neoplasm of breast: Secondary | ICD-10-CM

## 2020-11-11 ENCOUNTER — Other Ambulatory Visit: Payer: Self-pay | Admitting: Family Medicine

## 2020-11-11 ENCOUNTER — Encounter: Payer: Self-pay | Admitting: Family Medicine

## 2020-11-11 DIAGNOSIS — R928 Other abnormal and inconclusive findings on diagnostic imaging of breast: Secondary | ICD-10-CM

## 2020-11-30 ENCOUNTER — Other Ambulatory Visit: Payer: Self-pay

## 2020-11-30 ENCOUNTER — Ambulatory Visit
Admission: RE | Admit: 2020-11-30 | Discharge: 2020-11-30 | Disposition: A | Discharge status: Home / Self Care | Payer: PRIVATE HEALTH INSURANCE | Source: Ambulatory Visit | Attending: Family Medicine | Admitting: Family Medicine

## 2020-11-30 DIAGNOSIS — R928 Other abnormal and inconclusive findings on diagnostic imaging of breast: Secondary | ICD-10-CM

## 2020-12-03 ENCOUNTER — Other Ambulatory Visit: Payer: PRIVATE HEALTH INSURANCE

## 2020-12-23 ENCOUNTER — Encounter: Payer: Self-pay | Admitting: *Deleted

## 2021-03-31 ENCOUNTER — Ambulatory Visit (INDEPENDENT_AMBULATORY_CARE_PROVIDER_SITE_OTHER): Payer: PRIVATE HEALTH INSURANCE | Admitting: Family Medicine

## 2021-03-31 ENCOUNTER — Other Ambulatory Visit: Payer: Self-pay

## 2021-03-31 DIAGNOSIS — Z23 Encounter for immunization: Secondary | ICD-10-CM | POA: Diagnosis not present

## 2021-04-30 ENCOUNTER — Ambulatory Visit: Payer: No Typology Code available for payment source | Admitting: Podiatry

## 2021-04-30 ENCOUNTER — Other Ambulatory Visit: Payer: Self-pay

## 2021-04-30 ENCOUNTER — Ambulatory Visit (INDEPENDENT_AMBULATORY_CARE_PROVIDER_SITE_OTHER): Payer: No Typology Code available for payment source

## 2021-04-30 DIAGNOSIS — M778 Other enthesopathies, not elsewhere classified: Secondary | ICD-10-CM

## 2021-04-30 DIAGNOSIS — M792 Neuralgia and neuritis, unspecified: Secondary | ICD-10-CM

## 2021-04-30 DIAGNOSIS — M79673 Pain in unspecified foot: Secondary | ICD-10-CM | POA: Diagnosis not present

## 2021-04-30 DIAGNOSIS — G8929 Other chronic pain: Secondary | ICD-10-CM

## 2021-04-30 DIAGNOSIS — M7751 Other enthesopathy of right foot: Secondary | ICD-10-CM | POA: Diagnosis not present

## 2021-05-03 NOTE — Progress Notes (Signed)
Subjective: 45 year old female presents the office today for concerns of chronic foot pain on both sides.  She states she gets pain points on the first metatarsal bone into the this been ongoing for the last couple of years.  Her pain level 6/10.  She states it hurts sometimes in the morning but is mostly towards the end of the day after being on her feet.  Is worse at nighttime.  She said at times even hurts when she puts blankets over her feet.  The left side is equal to the right.  Previously left side worse and worse.  No numbness or tingling.  No significant swelling.  No injury.  Patient tried injections, orthotics.  Previously tried injection, orthotics without significant improvement.  She is currently wearing Hoka which seem to do the best.  She is also on gabapentin at nighttime intermittently which seems to help some.  Objective: AAO x3, NAD DP/PT pulses palpable bilaterally, CRT less than 3 seconds There is no area of pinpoint tenderness identified bilaterally.  Specific edema.  Tenderness starts on the first MPJ going laterally.  Not able to palpate a neuroma today.  There is no area of pinpoint tenderness.  MMT 5/5.  Rectus foot type.  No pain with calf compression, swelling, warmth, erythema  Assessment: Capsulitis, chronic foot pain  Plan: -All treatment options discussed with the patient including all alternatives, risks, complications.  -X-rays obtained reviewed.  No subacute fracture or stress fracture noted today. -At this point given her chronic forefoot pain I would order an MRI to further evaluate this.  I also ordered blood work including rheumatoid factor, ANA, HLA-B27, ESR, CRP.  This was given a written prescription and she can go to her husband's work to have this done. -If testing is negative consider neurology referral. -Patient encouraged to call the office with any questions, concerns, change in symptoms.   [name]

## 2021-05-07 ENCOUNTER — Other Ambulatory Visit: Payer: Self-pay | Admitting: Podiatry

## 2021-05-07 ENCOUNTER — Encounter: Payer: Self-pay | Admitting: Podiatry

## 2021-05-07 DIAGNOSIS — M7751 Other enthesopathy of right foot: Secondary | ICD-10-CM

## 2021-05-07 DIAGNOSIS — M778 Other enthesopathies, not elsewhere classified: Secondary | ICD-10-CM

## 2021-05-07 DIAGNOSIS — M792 Neuralgia and neuritis, unspecified: Secondary | ICD-10-CM

## 2021-05-07 DIAGNOSIS — D3610 Benign neoplasm of peripheral nerves and autonomic nervous system, unspecified: Secondary | ICD-10-CM

## 2021-05-11 ENCOUNTER — Telehealth: Payer: Self-pay | Admitting: *Deleted

## 2021-05-11 NOTE — Telephone Encounter (Signed)
Patient has an appointment on 05-30-2021 at [facility] imaging. Lattie Haw

## 2021-05-11 NOTE — Telephone Encounter (Signed)
-----   Message from Trula Slade, DPM sent at 05/07/2021  4:37 PM EST ----- Can someone please follow up on the MRI that was ordered? Patient has not heard anything yet. Thank you

## 2021-05-12 ENCOUNTER — Telehealth: Payer: Self-pay | Admitting: *Deleted

## 2021-05-12 NOTE — Telephone Encounter (Signed)
Called and left a message for the patient per Dr Jacqualyn Posey that the blood work came back negative and if any concerns or questions to give [name] a call. Victoria Burch

## 2021-05-18 NOTE — Progress Notes (Signed)
Victoria Burch is a 45 y.o. female presents to the office today for flu shot per physician's orders.   [name]

## 2021-05-30 ENCOUNTER — Other Ambulatory Visit: Payer: No Typology Code available for payment source

## 2021-05-30 ENCOUNTER — Other Ambulatory Visit: Payer: PRIVATE HEALTH INSURANCE

## 2021-06-09 ENCOUNTER — Encounter: Payer: Self-pay | Admitting: Family Medicine

## 2021-06-09 ENCOUNTER — Ambulatory Visit (INDEPENDENT_AMBULATORY_CARE_PROVIDER_SITE_OTHER): Payer: No Typology Code available for payment source | Admitting: Family Medicine

## 2021-06-09 VITALS — BP 120/72 | HR 104 | Temp 98.5°F | Resp 16 | Ht 61.0 in | Wt 155.0 lb

## 2021-06-09 DIAGNOSIS — Z114 Encounter for screening for human immunodeficiency virus [HIV]: Secondary | ICD-10-CM

## 2021-06-09 DIAGNOSIS — E663 Overweight: Secondary | ICD-10-CM

## 2021-06-09 DIAGNOSIS — Z1159 Encounter for screening for other viral diseases: Secondary | ICD-10-CM

## 2021-06-09 DIAGNOSIS — H8111 Benign paroxysmal vertigo, right ear: Secondary | ICD-10-CM | POA: Diagnosis not present

## 2021-06-09 DIAGNOSIS — Z0000 Encounter for general adult medical examination without abnormal findings: Secondary | ICD-10-CM | POA: Diagnosis not present

## 2021-06-09 MED ORDER — PROMETHAZINE HCL 25 MG PO TABS: 25.0000 mg | ORAL_TABLET | Freq: Three times a day (TID) | ORAL | 0 refills | Status: DC | PRN

## 2021-06-09 MED ORDER — SCOPOLAMINE 1 MG/3DAYS TD PT72
1.0000 | MEDICATED_PATCH | TRANSDERMAL | 3 refills | Status: DC
Start: 2021-06-09 — End: 2022-03-31

## 2021-06-09 NOTE — Progress Notes (Signed)
Subjective:    Patient ID: Victoria Burch, female    DOB: [birth year], 45 y.o.   MRN: [id number]  HPI CPE- UTD on mammo, no need for pap due to hysterectomy.  UTD on Tdap, flu.  Patient Care Team    Relationship Specialty Notifications Start End  Midge Minium, MD PCP - General Family Medicine  01/15/16   Paula Compton, MD Consulting Physician Obstetrics and Gynecology  01/20/17     Health Maintenance  Topic Date Due   HIV Screening  Never done   Hepatitis C Screening  Never done   COVID-19 Vaccine (4 - Booster for [place] series) 06/25/2021 (Originally 05/21/2020)   TETANUS/TDAP  08/26/2025   INFLUENZA VACCINE  Completed   Pneumococcal Vaccine 46-14 Years old  Aged Out   HPV VACCINES  Aged Out    Vertigo- woke up last Tuesday w/ sxs.  At onset was 'staggering'.  Started doing Epley maneuver to improve sxs.  Having difficulty sleeping b/c she sleeps flat on her back.  Was started on Prednisone and Doxy for possible sinus infxn.  Decreased water intake.  Has OTC Bonine w/o relief  Review of Systems Patient reports no vision/ hearing changes, adenopathy,fever, weight change,  persistant/recurrent hoarseness , swallowing issues, chest pain, palpitations, edema, persistant/recurrent cough, hemoptysis, dyspnea (rest/exertional/paroxysmal nocturnal), gastrointestinal bleeding (melena, rectal bleeding), abdominal pain, significant heartburn, bowel changes, GU symptoms (dysuria, hematuria, incontinence), Gyn symptoms (abnormal  bleeding, pain),  syncope, focal weakness, memory loss, numbness & tingling, skin/hair/nail changes, abnormal bruising or bleeding.   Anxiety- both kids are struggling emotionally, finances are difficult.  She has been on medication in the past and would prefer not to take at this time  This visit occurred during the SARS-CoV-2 public health emergency.  Safety protocols were in place, including screening questions prior to the visit, additional usage of staff  PPE, and extensive cleaning of exam room while observing appropriate contact time as indicated for disinfecting solutions.      Objective:   Physical Exam General Appearance:    Alert, cooperative, no distress, appears stated age  Head:    Normocephalic, without obvious abnormality, atraumatic  Eyes:    PERRL, conjunctiva/corneas clear, EOM's intact, fundi    benign, both eyes  Ears:    Normal TM's and external ear canals, both ears  Nose:   Deferred due to COVID  Throat:   Neck:   Supple, symmetrical, trachea midline, no adenopathy;    Thyroid: no enlargement/tenderness/nodules  Back:     Symmetric, no curvature, ROM normal, no CVA tenderness  Lungs:     Clear to auscultation bilaterally, respirations unlabored  Chest Wall:    No tenderness or deformity   Heart:    Regular rate and rhythm, S1 and S2 normal, no murmur, rub   or gallop  Breast Exam:    Deferred to GYN  Abdomen:     Soft, non-tender, bowel sounds active all four quadrants,    no masses, no organomegaly  Genitalia:    Deferred to GYN  Rectal:    Extremities:   Extremities normal, atraumatic, no cyanosis or edema  Pulses:   2+ and symmetric all extremities  Skin:   Skin color, texture, turgor normal, no rashes or lesions  Lymph nodes:   Cervical, supraclavicular, and axillary nodes normal  Neurologic:   CNII-XII intact, normal strength, sensation and reflexes    throughout          Assessment & Plan:  Vertigo- new.  Sxs started last Tuesday and were quite pronounced.  Thankfully this has improved somewhat w/ home Epley maneuver.  Still not able to sleep on her back- her preferred position due to dizziness.  Was treated w/ Doxy and Prednisone for possible sinus infection but this did not improve dizziness.  Will start scopolamine patch and promethazine for dizziness and nausea.  If not improving, she is to let me know so we can refer to ENT or neuro.  Pt expressed understanding and is in agreement w/ plan.

## 2021-06-09 NOTE — Patient Instructions (Addendum)
Follow up in 1 year or as needed We'll notify you of your lab results and make any changes if needed Continue to work on healthy diet and regular exercise- you can do it! Continue to use the Meclizine as needed for dizziness Use the promethazine as needed for dizziness or nausea Increase your water intake to help w/ dizziness Call with any questions or concerns Stay Safe!  Stay Healthy! Hang in there!! Happy Holidays!!!

## 2021-06-24 ENCOUNTER — Encounter: Payer: Self-pay | Admitting: Family Medicine

## 2021-06-24 NOTE — Telephone Encounter (Signed)
Patient is giving you a update and prayers please advise.

## 2021-07-07 NOTE — Assessment & Plan Note (Signed)
Pt's PE WNL.  UTD on mammo, Tdap, flu.  Will check labs to risk stratify.  Anticipatory guidance provided.

## 2021-07-07 NOTE — Assessment & Plan Note (Signed)
Encouraged healthy diet and regular exercise.  Pt will get labs done at [facility] b/c they are free.

## 2021-09-26 ENCOUNTER — Encounter: Payer: Self-pay | Admitting: Family Medicine

## 2021-10-05 ENCOUNTER — Encounter: Payer: Self-pay | Admitting: Podiatry

## 2021-10-05 NOTE — Telephone Encounter (Signed)
Please advise 

## 2021-10-07 ENCOUNTER — Other Ambulatory Visit: Payer: Self-pay | Admitting: Podiatry

## 2021-10-07 DIAGNOSIS — D3610 Benign neoplasm of peripheral nerves and autonomic nervous system, unspecified: Secondary | ICD-10-CM

## 2021-10-07 DIAGNOSIS — M778 Other enthesopathies, not elsewhere classified: Secondary | ICD-10-CM

## 2021-10-07 NOTE — Telephone Encounter (Signed)
I put in an order for MRI of both feet for [place] [facility]. Can you please follow up on this? Thanks! ?

## 2021-10-08 NOTE — Telephone Encounter (Signed)
Called [facility] ([facility]),just received order, will call the patient today to schedule.

## 2021-10-17 ENCOUNTER — Ambulatory Visit
Admission: RE | Admit: 2021-10-17 | Discharge: 2021-10-17 | Disposition: A | Discharge status: Home / Self Care | Payer: PRIVATE HEALTH INSURANCE | Source: Ambulatory Visit | Attending: Podiatry | Admitting: Podiatry

## 2021-10-17 DIAGNOSIS — M778 Other enthesopathies, not elsewhere classified: Secondary | ICD-10-CM

## 2021-10-17 DIAGNOSIS — D3610 Benign neoplasm of peripheral nerves and autonomic nervous system, unspecified: Secondary | ICD-10-CM

## 2021-10-19 ENCOUNTER — Other Ambulatory Visit: Payer: Self-pay | Admitting: Podiatry

## 2021-10-19 MED ORDER — CELECOXIB 100 MG PO CAPS: 100.0000 mg | ORAL_CAPSULE | Freq: Two times a day (BID) | ORAL | 0 refills | Status: DC

## 2021-10-19 NOTE — Telephone Encounter (Signed)
I called the patient back to go over the results. I have sent over Celebrex to the pharmacy. I have also ordered PT. we discussed different exercises she can do on her own at home.  Continue with supportive shoe gear.  She has not had any recent injury since I last saw her.  I think that the middle cuneiform is more from stress and compensation but will monitor this.  Patient verbalized understanding and no further questions.  I will see her back in the office in 4 weeks. ?

## 2021-10-20 ENCOUNTER — Other Ambulatory Visit: Payer: Self-pay | Admitting: Family Medicine

## 2021-10-20 DIAGNOSIS — Z1231 Encounter for screening mammogram for malignant neoplasm of breast: Secondary | ICD-10-CM

## 2021-11-11 ENCOUNTER — Ambulatory Visit: Payer: PRIVATE HEALTH INSURANCE

## 2021-11-11 ENCOUNTER — Other Ambulatory Visit: Payer: Self-pay | Admitting: Podiatry

## 2021-11-11 ENCOUNTER — Ambulatory Visit
Admission: RE | Admit: 2021-11-11 | Discharge: 2021-11-11 | Disposition: A | Discharge status: Home / Self Care | Payer: PRIVATE HEALTH INSURANCE | Source: Ambulatory Visit | Attending: Family Medicine | Admitting: Family Medicine

## 2021-11-11 DIAGNOSIS — Z1231 Encounter for screening mammogram for malignant neoplasm of breast: Secondary | ICD-10-CM

## 2021-11-11 NOTE — Telephone Encounter (Signed)
Please advise 

## 2021-11-12 ENCOUNTER — Other Ambulatory Visit: Payer: Self-pay | Admitting: Family Medicine

## 2021-11-12 DIAGNOSIS — R928 Other abnormal and inconclusive findings on diagnostic imaging of breast: Secondary | ICD-10-CM

## 2021-11-16 ENCOUNTER — Ambulatory Visit: Payer: PRIVATE HEALTH INSURANCE | Admitting: Podiatry

## 2021-11-16 ENCOUNTER — Ambulatory Visit: Payer: PRIVATE HEALTH INSURANCE

## 2021-11-16 DIAGNOSIS — M21619 Bunion of unspecified foot: Secondary | ICD-10-CM | POA: Diagnosis not present

## 2021-11-16 DIAGNOSIS — M722 Plantar fascial fibromatosis: Secondary | ICD-10-CM | POA: Diagnosis not present

## 2021-11-16 DIAGNOSIS — M778 Other enthesopathies, not elsewhere classified: Secondary | ICD-10-CM

## 2021-11-16 DIAGNOSIS — M7752 Other enthesopathy of left foot: Secondary | ICD-10-CM

## 2021-11-16 NOTE — Patient Instructions (Addendum)
While at your visit today you received a steroid injection in your foot or ankle to help with your pain. Along with having the steroid medication there is some "numbing" medication in the shot that you received. Due to this you may notice some numbness to the area for the next couple of hours.   I would recommend limiting activity for the next few days to help the steroid injection take affect.    The actually benefit from the steroid injection may take up to 2-7 days to see a difference. You may actually experience a small (as in 10%) INCREASE in pain in the first 24 hours---that is common. It would be best if you can ice the area today and take anti-inflammatory medications (such as Ibuprofen, Motrin, or Aleve) if you are able to take these medications. If you were prescribed another medication to help with the pain go ahead and start that medication today    Things to watch out for that you should contact us or a health care provider urgently would include: 1. Unusual (as in more than 10%) increase in pain 2. New fever > 101.5 3. New swelling or redness of the injected area.  4. Streaking of red lines around the area injected.  If you have any questions or concerns about this, please give our office a call at [phone].    Bunion A bunion (hallux valgus) is a bump that forms slowly on the inner side of the big toe joint. It occurs when the big toe turns toward the second toe. Bunions may be small at first, but they often get larger over time. They can make walking painful. What are the causes? This condition may be caused by: Wearing narrow or pointed shoes that force the big toe to press against the other toes. Abnormal foot development that causes the foot to roll inward. Changes in the foot that are caused by certain diseases, such as rheumatoid arthritis or polio. A foot injury. What increases the risk? The following factors may make you more likely to develop this  condition: Wearing shoes that squeeze the toes together. Having certain diseases, such as: Rheumatoid arthritis. Polio. Cerebral palsy. Having family members who have bunions. Being born with abnormally shaped feet (a foot deformity), such as flat feet or low arches. Doing activities that put a lot of pressure on the feet, such as ballet dancing. What are the signs or symptoms?  The main symptom of this condition is a bump on your big toe that you can notice. Other symptoms may include: Pain. Redness and inflammation around your big toe. Thick or hardened skin on your big toe or between your toes. Stiffness or loss of motion in your big toe. Trouble with walking. How is this diagnosed? This condition may be diagnosed based on your symptoms, medical history, and activities. You may also have tests and imaging, such as: X-rays. These allow your health care provider to check the position of the bones in your foot and look for damage to your joint. They also help your health care provider determine the severity of your bunion and the best way to treat it. Joint aspiration. In this test, a sample of fluid is removed from the toe joint. This test may be done if you are in a lot of pain. It helps rule out diseases that cause painful swelling of the joints, such as arthritis or gout. How is this treated? Treatment depends on the severity of your symptoms. The goal of   treatment is to relieve symptoms and prevent your bunion from getting worse. Your health care provider may recommend: Wearing shoes that have a wide toe box, or using bunion pads to cushion the affected area. Taping your toes together to keep them in a normal position. Placing a device inside your shoe (orthotic device) to help reduce pressure on your toe joint. Taking medicine to ease pain and inflammation. Putting ice or heat on the affected area. Doing stretching exercises. Surgery, for severe cases. Follow these instructions  at home: Managing pain, stiffness, and swelling     If directed, put ice on the painful area. To do this: Put ice in a plastic bag. Place a towel between your skin and the bag. Leave the ice on for 20 minutes, 2-3 times a day. Remove the ice if your skin turns bright red. This is very important. If you cannot feel pain, heat, or cold, you have a greater risk of damage to the area. If directed, apply heat to the affected area before you exercise. Use the heat source that your health care provider recommends, such as a moist heat pack or a heating pad. Place a towel between your skin and the heat source. Leave the heat on for 20-30 minutes. Remove the heat if your skin turns bright red. This is especially important if you are unable to feel pain, heat, or cold. You have a greater risk of getting burned. General instructions Do exercises as told by your health care provider. Support your toe joint with proper footwear, shoe padding, or taping as told by your health care provider. Take over-the-counter and prescription medicines only as told by your health care provider. Do not use any products that contain nicotine or tobacco, such as cigarettes, e-cigarettes, and chewing tobacco. If you need help quitting, ask your health care provider. Keep all follow-up visits. This is important. Contact a health care provider if: Your symptoms get worse. Your symptoms do not improve in 2 weeks. Get help right away if: You have severe pain and trouble with walking. Summary A bunion is a bump on the inner side of the big toe joint that forms when the big toe turns toward the second toe. Bunions can make walking painful. Treatment depends on the severity of your symptoms. Support your toe joint with proper footwear, shoe padding, or taping as told by your health care provider. This information is not intended to replace advice given to you by your health care provider. Make sure you discuss any questions  you have with your health care provider. Document Revised: 10/18/2019 Document Reviewed: 10/18/2019 Elsevier Patient Education  2023 [employer]  

## 2021-11-17 ENCOUNTER — Encounter: Payer: Self-pay | Admitting: Podiatry

## 2021-11-18 NOTE — Telephone Encounter (Signed)
I called and spoke with the patient and went over all of her questions. She wants to proceed with surgery the last week of June for bilateral bunion surgery Liane Comber bunionectomy). Shelly- can you put her down? She will need to come in for consent.

## 2021-11-23 NOTE — Progress Notes (Signed)
Subjective: 46 year old female presents the office today for concerns of ongoing pain to both of her feet.  She gets pain most of the first metatarsal as well as to the heels.  Overall she is doing about the same.  She been doing physical therapy with any significant improvement.  No recent injury or changes otherwise.  Objective: AAO x3, NAD DP/PT pulses palpable bilaterally, CRT less than 3 seconds Tenderness mostly on the first MPJs bilaterally along the MEDIAL aspect along the area of mild bunion.  There is no pain or crepitation with first MPJ range of motion.  No hypermobility.  There is mild erythema along the area of the first metatarsal eminence, bunion.  Mild discomfort on the plantar fascial insertion.  No other areas of discomfort.  MMT 5/5. No pain with calf compression, swelling, warmth, erythema  Assessment: Bilateral first MPJ capsulitis, bunion; Plantar fasciitis  Plan: -All treatment options discussed with the patient including all alternatives, risks, complications.  -We had a long discussion today in regards to both conservative as well as surgical treatment options.  The majority of her discomfort is.  Along the first MPJ on the medial aspect along the area of bunion.  We discussed both conservative as well as surgical treatment options for the bunions.  Discussed shoe modifications, further offloading, anti-inflammatories.  Steroid injection was performed bilaterally.  Skin was cleaned with Betadine, alcohol and a mixture of 0.5 cc of Marcaine plain, 0.5 cc of dexamethasone phosphate were infiltrated bilaterally.  Postinjection care discussed.  Tolerated well.  Ultimately we discussed surgical intervention we discussed Austin bunionectomy.  We discussed doing 1 side at a time versus bilaterally as her insurance will be resetting in July and she like to have this done prior.  Discussed pros and cons of this. -In regards to the heel pain discussed stretching, icing on a regular  basis.  Continue with shoes and good arch support.  Also we decided do surgery discussed PRP injection. -Patient encouraged to call the office with any questions, concerns, change in symptoms.   Victoria Burch DPM

## 2021-11-24 ENCOUNTER — Ambulatory Visit
Admission: RE | Admit: 2021-11-24 | Discharge: 2021-11-24 | Disposition: A | Discharge status: Home / Self Care | Payer: PRIVATE HEALTH INSURANCE | Source: Ambulatory Visit | Attending: Family Medicine | Admitting: Family Medicine

## 2021-11-24 ENCOUNTER — Other Ambulatory Visit: Payer: Self-pay | Admitting: Family Medicine

## 2021-11-24 DIAGNOSIS — R928 Other abnormal and inconclusive findings on diagnostic imaging of breast: Secondary | ICD-10-CM

## 2021-11-24 DIAGNOSIS — R921 Mammographic calcification found on diagnostic imaging of breast: Secondary | ICD-10-CM

## 2021-11-26 ENCOUNTER — Ambulatory Visit: Payer: PRIVATE HEALTH INSURANCE | Admitting: Podiatry

## 2021-12-01 ENCOUNTER — Telehealth: Payer: Self-pay | Admitting: Urology

## 2021-12-01 NOTE — Telephone Encounter (Signed)
DOS - 12/22/21  [facility]  BILAT --- [id number]  [employer] EFFECTIVE DATE - 12/25/20  PLAN DEDUCTIBLE - $2,500.00 W/ $0.00 REMAINING OUT OF POCKET - $5,500.00 W/ $1,651.62 REMAINING COINSURANCE - 30% COPAY - $0.00   SPOKE WITH LINDA WITH [employer] AND SHE STATED THAT FOR CPT CODE 50518 NO PRIOR AUTH IS REQUIRED.  REF # LINDA B. 12/01/21

## 2021-12-05 ENCOUNTER — Other Ambulatory Visit: Payer: Self-pay | Admitting: Podiatry

## 2021-12-11 ENCOUNTER — Encounter: Payer: Self-pay | Admitting: Podiatry

## 2021-12-13 NOTE — Telephone Encounter (Signed)
Please advise 

## 2021-12-14 ENCOUNTER — Ambulatory Visit: Payer: PRIVATE HEALTH INSURANCE | Admitting: Podiatry

## 2021-12-14 ENCOUNTER — Encounter: Payer: Self-pay | Admitting: Podiatry

## 2021-12-14 DIAGNOSIS — M778 Other enthesopathies, not elsewhere classified: Secondary | ICD-10-CM | POA: Diagnosis not present

## 2021-12-14 DIAGNOSIS — B351 Tinea unguium: Secondary | ICD-10-CM | POA: Diagnosis not present

## 2021-12-14 DIAGNOSIS — M21619 Bunion of unspecified foot: Secondary | ICD-10-CM | POA: Diagnosis not present

## 2021-12-14 DIAGNOSIS — M7989 Other specified soft tissue disorders: Secondary | ICD-10-CM

## 2021-12-14 DIAGNOSIS — M722 Plantar fascial fibromatosis: Secondary | ICD-10-CM

## 2021-12-14 NOTE — Progress Notes (Unsigned)
Left hallux nail cultre B/l austin, prp, possible cyst right 3rd

## 2021-12-21 ENCOUNTER — Encounter: Payer: Self-pay | Admitting: Podiatry

## 2021-12-22 ENCOUNTER — Encounter: Payer: Self-pay | Admitting: Podiatry

## 2021-12-22 ENCOUNTER — Other Ambulatory Visit: Payer: Self-pay | Admitting: Podiatry

## 2021-12-22 DIAGNOSIS — M2012 Hallux valgus (acquired), left foot: Secondary | ICD-10-CM | POA: Diagnosis not present

## 2021-12-22 DIAGNOSIS — M722 Plantar fascial fibromatosis: Secondary | ICD-10-CM | POA: Diagnosis not present

## 2021-12-22 MED ORDER — PROMETHAZINE HCL 25 MG PO TABS: 25.0000 mg | ORAL_TABLET | Freq: Three times a day (TID) | ORAL | 0 refills | Status: DC | PRN

## 2021-12-22 MED ORDER — OXYCODONE-ACETAMINOPHEN 5-325 MG PO TABS: 1.0000 | ORAL_TABLET | Freq: Four times a day (QID) | ORAL | 0 refills | Status: DC | PRN

## 2021-12-22 MED ORDER — CLINDAMYCIN HCL 300 MG PO CAPS: 300.0000 mg | ORAL_CAPSULE | Freq: Three times a day (TID) | ORAL | 0 refills | Status: DC

## 2021-12-22 NOTE — Progress Notes (Signed)
Postop medications sent 

## 2021-12-23 ENCOUNTER — Encounter: Payer: Self-pay | Admitting: Podiatry

## 2021-12-23 ENCOUNTER — Telehealth: Payer: Self-pay | Admitting: Podiatry

## 2021-12-23 ENCOUNTER — Other Ambulatory Visit: Payer: Self-pay | Admitting: Podiatry

## 2021-12-23 MED ORDER — OXYCODONE-ACETAMINOPHEN 5-325 MG PO TABS: 1.0000 | ORAL_TABLET | Freq: Four times a day (QID) | ORAL | 0 refills | Status: DC | PRN

## 2021-12-23 NOTE — Telephone Encounter (Signed)
I called the patient to see how she was doing but noticed she also sent me a mychart message (I never received this). I refilled the pain medication so she would not run out over the weekend.  She has been taking 2 tablets at a time and alternate ibuprofen.  She is icing and elevating.  I discussed loosening the bandage as well.  Encouraged to call back with any questions or concerns.  Discussed intraoperative findings as well.

## 2021-12-27 ENCOUNTER — Other Ambulatory Visit: Payer: Self-pay | Admitting: Podiatry

## 2021-12-29 ENCOUNTER — Ambulatory Visit (INDEPENDENT_AMBULATORY_CARE_PROVIDER_SITE_OTHER): Payer: 59

## 2021-12-29 ENCOUNTER — Ambulatory Visit: Payer: PRIVATE HEALTH INSURANCE

## 2021-12-29 ENCOUNTER — Ambulatory Visit (INDEPENDENT_AMBULATORY_CARE_PROVIDER_SITE_OTHER): Payer: 59 | Admitting: Podiatry

## 2021-12-29 ENCOUNTER — Telehealth: Payer: Self-pay

## 2021-12-29 DIAGNOSIS — Z9889 Other specified postprocedural states: Secondary | ICD-10-CM

## 2021-12-29 DIAGNOSIS — M2012 Hallux valgus (acquired), left foot: Secondary | ICD-10-CM | POA: Diagnosis not present

## 2021-12-29 DIAGNOSIS — T81509A Unspecified complication of foreign body accidentally left in body following unspecified procedure, initial encounter: Secondary | ICD-10-CM

## 2021-12-29 DIAGNOSIS — M21619 Bunion of unspecified foot: Secondary | ICD-10-CM

## 2021-12-29 MED ORDER — GABAPENTIN 100 MG PO CAPS: 100.0000 mg | ORAL_CAPSULE | Freq: Three times a day (TID) | ORAL | 3 refills | Status: DC

## 2021-12-29 MED ORDER — OXYCODONE-ACETAMINOPHEN 5-325 MG PO TABS: 1.0000 | ORAL_TABLET | Freq: Four times a day (QID) | ORAL | 0 refills | Status: DC | PRN

## 2021-12-29 NOTE — Telephone Encounter (Signed)
Patient just left the office for a routine post op visit & states that she is out of pain medication & would like a refill.

## 2021-12-30 ENCOUNTER — Encounter: Payer: Self-pay | Admitting: Podiatry

## 2021-12-30 ENCOUNTER — Telehealth: Payer: Self-pay | Admitting: Podiatry

## 2021-12-30 MED ORDER — OXYCODONE-ACETAMINOPHEN 5-325 MG PO TABS: 1.0000 | ORAL_TABLET | Freq: Four times a day (QID) | ORAL | 0 refills | Status: DC | PRN

## 2021-12-30 NOTE — Addendum Note (Signed)
Addended by: Boneta Lucks on: 12/30/2021 02:21 PM   Modules accepted: Orders

## 2021-12-30 NOTE — Telephone Encounter (Signed)
Pain medicine sent yesterday by Dr. Posey Pronto. Please notify patient. - Dr. Amalia Hailey

## 2021-12-30 NOTE — Telephone Encounter (Signed)
Pt called stating the oxyCODONE-acetaminophen (PERCOCET/ROXICET) 5-325 MG tablet was never called in after her appt. yesterday. She is requesting it to be sent in.  Please advise.

## 2021-12-31 ENCOUNTER — Other Ambulatory Visit: Payer: Self-pay | Admitting: Podiatry

## 2021-12-31 NOTE — Telephone Encounter (Signed)
Please advise 

## 2021-12-31 NOTE — Progress Notes (Signed)
error 

## 2022-01-04 NOTE — Progress Notes (Signed)
Subjective:  Patient ID: Victoria Burch, female    DOB: 1976/05/25,  MRN: [id number]  Chief Complaint  Patient presents with   Routine Post Op    POV #1 DOS 12/22/2021 [facility] [place]     DOS: 12/22/2021 Procedure: Right bunionectomy  46 y.o. female returns for post-op check.  Patient states she is doing okay.  Minimal pain.  Is constant pain at night.  Burning sensation.  She has been taking her pain medication.  She is having sharp shooting pain.  Review of Systems: Negative except as noted in the HPI. Denies N/V/F/Ch.  Past Medical History:  Diagnosis Date   Adjustment disorder with mixed anxiety and depressed mood    Anxiety    Atypical squamous cells of undetermined significance on cytologic smear of cervix (ASC-US)    History of headache     Current Outpatient Medications:    gabapentin (NEURONTIN) 100 MG capsule, Take 1 capsule (100 mg total) by mouth 3 (three) times daily., Disp: 90 capsule, Rfl: 3   celecoxib (CELEBREX) 100 MG capsule, TAKE 1 CAPSULE BY MOUTH TWICE A DAY, Disp: 60 capsule, Rfl: 0   Cetirizine HCl (ZYRTEC PO), Take by mouth., Disp: , Rfl:    clindamycin (CLEOCIN) 300 MG capsule, Take 1 capsule (300 mg total) by mouth 3 (three) times daily., Disp: 9 capsule, Rfl: 0   doxycycline (VIBRA-TABS) 100 MG tablet, SMARTSIG:1 Tablet(s) By Mouth Every 12 Hours, Disp: , Rfl:    oxyCODONE-acetaminophen (PERCOCET/ROXICET) 5-325 MG tablet, Take 1-2 tablets by mouth every 6 (six) hours as needed for severe pain., Disp: 25 tablet, Rfl: 0   Polyethylene Glycol 3350 (MIRALAX PO), Take by mouth., Disp: , Rfl:    promethazine (PHENERGAN) 25 MG tablet, Take 1 tablet (25 mg total) by mouth every 8 (eight) hours as needed for nausea or vomiting., Disp: 30 tablet, Rfl: 0   promethazine (PHENERGAN) 25 MG tablet, Take 1 tablet (25 mg total) by mouth every 8 (eight) hours as needed for nausea or vomiting., Disp: 20 tablet, Rfl: 0   scopolamine (TRANSDERM-SCOP) 1 MG/3DAYS, Place 1  patch (1.5 mg total) onto the skin every 3 (three) days., Disp: 4 patch, Rfl: 3  Social History   Tobacco Use  Smoking Status Never  Smokeless Tobacco Never    Allergies  Allergen Reactions   Other Itching and Rash   Adhesive [Tape] Itching and Rash   Penicillins Other (See Comments)    Some stomach upset   Objective:  There were no vitals filed for this visit. There is no height or weight on file to calculate BMI. Constitutional Well developed. Well nourished.  Vascular Foot warm and well perfused. Capillary refill normal to all digits.   Neurologic Normal speech. Oriented to person, place, and time. Epicritic sensation to light touch grossly present bilaterally.  Dermatologic Skin healing well without signs of infection. Skin edges well coapted without signs of infection.  Orthopedic: Tenderness to palpation noted about the surgical site.   Radiographs: 3 views of skeletally mature adult right foot: Good correction alignment noted reduction of bunion deformity noted. Assessment:   1. Status post foot surgery   2. Bunion    Plan:  Patient was evaluated and treated and all questions answered.  S/p foot surgery left -Progressing as expected post-operatively. -XR: See above -WB Status: Partial weightbearing to the heel in cam boot -Sutures: .  Intact no clinical signs of Deis is noted.  No complication noted. -Medications: Gabapentin for nerve pain -Foot redressed.  No follow-ups on file.

## 2022-01-06 ENCOUNTER — Ambulatory Visit (INDEPENDENT_AMBULATORY_CARE_PROVIDER_SITE_OTHER): Payer: 59 | Admitting: Podiatry

## 2022-01-06 DIAGNOSIS — M21619 Bunion of unspecified foot: Secondary | ICD-10-CM

## 2022-01-06 DIAGNOSIS — Z9889 Other specified postprocedural states: Secondary | ICD-10-CM

## 2022-01-08 ENCOUNTER — Encounter: Payer: Self-pay | Admitting: Podiatry

## 2022-01-09 DIAGNOSIS — M21619 Bunion of unspecified foot: Secondary | ICD-10-CM | POA: Insufficient documentation

## 2022-01-09 NOTE — Progress Notes (Signed)
Subjective: Victoria Burch is a 46 y.o. is seen today in office s/p left foot Austin bunionectomy, PRP injection bilaterally preformed on 12/22/2021.  She is intermittent discomfort since the surgery but has seemed to ease off some.  She is using the cam boot and putting pressure more in the heel using crutches.  Denies any systemic complaints such as fevers, chills, nausea, vomiting. No calf pain, chest pain, shortness of breath.   Objective: General: No acute distress, AAOx3  DP/PT pulses palpable 2/4, CRT < 3 sec to all digits.  Protective sensation intact. Motor function intact.  Left foot: Incision is well coapted without any evidence of dehiscence and sutures intact. There is no surrounding erythema, ascending cellulitis, fluctuance, crepitus, malodor, drainage/purulence. There is mild edema around the surgical site. There is mild pain along the surgical site.  Clinically toes in rectus position. No other areas of tenderness to bilateral lower extremities.  No other open lesions or pre-ulcerative lesions.  No pain with calf compression, swelling, warmth, erythema.   Assessment and Plan:  Status post left foot bunionectomy, doing well with no complications   -Treatment options discussed including all alternatives, risks, and complications -I reviewed the prior x-rays with her.  Hardware intact with satisfactory alignment. -Discussed that if she starts to feel better she can start to transition to weightbearing as tolerated in a cam boot.  Continue ice, elevate. -Pain medication as needed. -Monitor for any clinical signs or symptoms of infection and DVT/PE and directed to call the office immediately should any occur or go to the ER. -Follow-up as scheduled or sooner if any problems arise. In the meantime, encouraged to call the office with any questions, concerns, change in symptoms.   X-ray next appointment  Celesta Gentile, DPM

## 2022-01-20 ENCOUNTER — Ambulatory Visit (INDEPENDENT_AMBULATORY_CARE_PROVIDER_SITE_OTHER): Payer: 59

## 2022-01-20 ENCOUNTER — Ambulatory Visit (INDEPENDENT_AMBULATORY_CARE_PROVIDER_SITE_OTHER): Payer: PRIVATE HEALTH INSURANCE | Admitting: Podiatry

## 2022-01-20 DIAGNOSIS — M21612 Bunion of left foot: Secondary | ICD-10-CM

## 2022-01-20 DIAGNOSIS — M722 Plantar fascial fibromatosis: Secondary | ICD-10-CM | POA: Diagnosis not present

## 2022-01-20 DIAGNOSIS — Z9889 Other specified postprocedural states: Secondary | ICD-10-CM

## 2022-01-21 ENCOUNTER — Other Ambulatory Visit: Payer: Self-pay | Admitting: Podiatry

## 2022-01-21 NOTE — Telephone Encounter (Signed)
Please advise 

## 2022-01-26 NOTE — Progress Notes (Signed)
Subjective: Chief Complaint  Patient presents with   Routine Post Op    POV #3 DOS 12/22/2021 [place] BUNIONECTOMY LEFT, pt is not having any N/V/F/C/SOB, Pain rate is a 7out of 10, pain is worse at night, pain meds, and Ice, X-rays taken today s    Victoria Burch is a 46 y.o. is seen today in office s/p left foot Austin bunionectomy, PRP injection bilaterally preformed on 12/22/2021.  Still describes discomfort mostly at nighttime.  She is still icing.  No fevers or chills that she reports.  Objective: General: No acute distress, AAOx3  DP/PT pulses palpable 2/4, CRT < 3 sec to all digits.  Protective sensation intact. Motor function intact.  Left foot: Incision is well coapted without any evidence of dehiscence.  There is mild swelling present there is no erythema or warmth.  The hardware is not palpable.  Clinically toes in rectus position. No other areas of tenderness to bilateral lower extremities.  No other open lesions or pre-ulcerative lesions.  No pain with calf compression, swelling, warmth, erythema.   Assessment and Plan:  Status post left foot bunionectomy, doing well with no complications   -Treatment options discussed including all alternatives, risks, and complications -X-rays obtained and reviewed of the left foot.  3 views were obtained.  Status post first metatarsal osteotomy with hardware intact.  One of the K wires is somewhat prominent. -Discussed that if the morning to be removed in the future we can do this once the bone is healed. -Small amount of antibiotic ointment and bandage applied.  She can wash the foot with soap and water, dry thoroughly and apply similar bandage. -Remain in surgical boot. -Ice, elevation. -Pain medication as needed. -Monitor for any clinical signs or symptoms of infection and DVT/PE and directed to call the office immediately should any occur or go to the ER. -Follow-up as scheduled or sooner if any problems arise. In the meantime,  encouraged to call the office with any questions, concerns, change in symptoms.   X-ray next appointment  Celesta Gentile, DPM

## 2022-01-29 ENCOUNTER — Encounter: Payer: Self-pay | Admitting: Podiatry

## 2022-02-01 ENCOUNTER — Encounter: Payer: Self-pay | Admitting: Podiatry

## 2022-02-03 ENCOUNTER — Ambulatory Visit (INDEPENDENT_AMBULATORY_CARE_PROVIDER_SITE_OTHER): Payer: PRIVATE HEALTH INSURANCE | Admitting: Podiatry

## 2022-02-03 ENCOUNTER — Ambulatory Visit: Payer: PRIVATE HEALTH INSURANCE

## 2022-02-03 DIAGNOSIS — Z9889 Other specified postprocedural states: Secondary | ICD-10-CM

## 2022-02-03 DIAGNOSIS — M79671 Pain in right foot: Secondary | ICD-10-CM

## 2022-02-03 DIAGNOSIS — M21612 Bunion of left foot: Secondary | ICD-10-CM

## 2022-02-03 NOTE — Progress Notes (Signed)
Subjective: Chief Complaint  Patient presents with   Routine Post Op    pt fell on surgical fooT [place]UST[place] BUNIONECTOMY B/L     Dymond Gutt Pequignot is a 46 y.o. is seen today in office s/p left foot Austin bunionectomy, PRP injection bilaterally preformed on 12/22/2021.  Patient sent a message that she had fallen on her surgical foot.  She had pain immediately after this but she states the pain is back to what it felt on Monday prior to the fall.  She still been wearing the cam boot.  No fevers or chills.  No other concerns.  Objective: General: No acute distress, AAOx3  DP/PT pulses palpable 2/4, CRT < 3 sec to all digits.  Protective sensation intact. Motor function intact.  Left foot: Incision is well coapted without any evidence of dehiscence and scar is formed.  There is mild scabbing still present on the incision.  There is mild swelling present there is no erythema or warmth.  The hardware is not palpable.  Clinically toes in rectus position.  No pain with imaging to motion. No other areas of tenderness to bilateral lower extremities.  No other open lesions or pre-ulcerative lesions.  No pain with calf compression, swelling, warmth, erythema.   Assessment and Plan:  Status post left foot bunionectomy, doing well with no complications   -Treatment options discussed including all alternatives, risks, and complications -X-rays obtained and reviewed of the left foot.  3 views were obtained.  Status post first metatarsal osteotomy with hardware intact.  One of the K wires is somewhat prominent., -Transition to surgical shoe then regular shoe as able.  -Compression ,  Elevation -Discussed starting range of motion, rehab exercises.  As she starts to improve then she can start to transition to regular shoe as tolerated but discussed gradual increase. -Pain medication as needed. -Monitor for any clinical signs or symptoms of infection and DVT/PE and directed to call the office immediately should  any occur or go to the ER. -Follow-up as scheduled or sooner if any problems arise. In the meantime, encouraged to call the office with any questions, concerns, change in symptoms.   X-ray next appointment  Celesta Gentile, DPM

## 2022-02-04 ENCOUNTER — Encounter: Payer: PRIVATE HEALTH INSURANCE | Admitting: Podiatry

## 2022-02-13 ENCOUNTER — Encounter: Payer: Self-pay | Admitting: Podiatry

## 2022-02-14 NOTE — Telephone Encounter (Signed)
Per Dr Jacqualyn Posey he will not be reoving the wires tomorrow that will have to be a scheduled surgery. She was a little confused at why we moved the appt . I explained for Dr Jacqualyn Posey to evaluate to see if the surgery to remove the wires needs to be scheduled.

## 2022-02-14 NOTE — Telephone Encounter (Signed)
Pt called back and I moved her appt to 8.22 and she is asking if you will be removing the wires at the appt tomorrow and if so does she need a driver and how long should she be here?

## 2022-02-15 ENCOUNTER — Other Ambulatory Visit: Payer: Self-pay | Admitting: Podiatry

## 2022-02-15 ENCOUNTER — Ambulatory Visit: Payer: PRIVATE HEALTH INSURANCE

## 2022-02-15 ENCOUNTER — Ambulatory Visit (INDEPENDENT_AMBULATORY_CARE_PROVIDER_SITE_OTHER): Payer: 59

## 2022-02-15 ENCOUNTER — Ambulatory Visit (INDEPENDENT_AMBULATORY_CARE_PROVIDER_SITE_OTHER): Payer: 59 | Admitting: Podiatry

## 2022-02-15 ENCOUNTER — Encounter: Payer: Self-pay | Admitting: Podiatry

## 2022-02-15 DIAGNOSIS — M21612 Bunion of left foot: Secondary | ICD-10-CM | POA: Diagnosis not present

## 2022-02-15 DIAGNOSIS — Z9889 Other specified postprocedural states: Secondary | ICD-10-CM

## 2022-02-15 DIAGNOSIS — Z969 Presence of functional implant, unspecified: Secondary | ICD-10-CM

## 2022-02-15 MED ORDER — CLINDAMYCIN HCL 300 MG PO CAPS: 300.0000 mg | ORAL_CAPSULE | Freq: Three times a day (TID) | ORAL | 0 refills | Status: DC

## 2022-02-15 MED ORDER — OXYCODONE-ACETAMINOPHEN 5-325 MG PO TABS: 1.0000 | ORAL_TABLET | Freq: Four times a day (QID) | ORAL | 0 refills | Status: DC | PRN

## 2022-02-20 ENCOUNTER — Other Ambulatory Visit: Payer: Self-pay | Admitting: Podiatry

## 2022-02-22 NOTE — Progress Notes (Signed)
Subjective: Chief Complaint  Patient presents with   Routine Post Op    Pov#5 left bunion repair, pt is still having some swelling very little pain, patient rates the pain a 6 out of 10 X-Rays were taken today      DONALEE GAUMOND is a 46 y.o. is seen today in office s/p left foot Austin bunionectomy, PRP injection bilaterally preformed on 12/22/2021.  She states that she can feel one of the wires moving out of her foot causing discomfort she like to have this removed.  No fevers or chills.  Objective: General: No acute distress, AAOx3  DP/PT pulses palpable 2/4, CRT < 3 sec to all digits.  Protective sensation intact. Motor function intact.  Left foot: Incision is well coapted without any evidence of dehiscence and scar is formed.  Able to palpate the proximal wire however it is not penetrating the skin.  There is trace edema is no erythema or warmth.  Tenderness palpation currently on this area.  No other open lesions or pre-ulcerative lesions.  No pain with calf compression, swelling, warmth, erythema.   Assessment and Plan:  Status post left foot bunionectomy, doing well with no complications   -Treatment options discussed including all alternatives, risks, and complications -X-rays obtained and reviewed of the left foot.  3 views were obtained.  Status post first metatarsal osteotomy with hardware intact.  One of the K wires is somewhat prominent,.  Drastically changed. -Likely able to the wire given the decrease swelling.  I discussed with her leaving the versus excising this.  Given the pain she like to have this removed today.  We discussed alternatives risks complications.  She was to receive this today.  Consent was signed.  Skin was initial anesthetized with 3 cc lidocaine, Marcaine plain.  Additional 3 cc was utilized for the procedure to ensure anesthesia.  1 cc lidocaine with epinephrine infiltrated for hemostasis.  Skin was prepped with DuraPrep.  This time I made an incision  with a 15 with scalpel of the prior incision through the epidermis and dermis.  Subcutaneous tissues with underlying sharp dissection making sure he had vomiting or vital structures as well as the extensor tendon.  Deep incision was made on the K wire which is prominent as was easily identified and removed.  This was removed in total.  The distal wire incision was carried distally in a layered fashion.  Was able to visualize the wire and appear to be firmly seated on the bone was not irritating based on what I could see.  Therefore at this time elected to proceed with keeping the wire intact and not removing this to help facilitate healing as well.  I copiously irrigated the incision with saline hemostasis was achieved.  Incision was then closed with Monocryl for the subcutaneous tissue the skin was then closed with nylon.  Betadine was painted over the incision followed by Xeroform dressing dressing.  At the conclusion of the procedure she was found to tolerate the procedure well any complications.  Postprocedure instructions discussed.  Remain in surgical shoe.  Antibiotics maintenance noted.  [name]     Timeout was performed.-Transition to surgical shoe then regular shoe as able.  -Compression ,  Elevation -Discussed starting range of motion, rehab exercises.  As she starts to improve then she can start to transition to regular shoe as tolerated but discussed gradual increase. -Pain medication as needed. -Monitor for any clinical signs or symptoms of infection and DVT/PE  and directed to call the office immediately should any occur or go to the ER. -Follow-up as scheduled or sooner if any problems arise. In the meantime, encouraged to call the office with any questions, concerns, change in symptoms.   X-ray next appointment  Celesta Gentile, DPM

## 2022-02-24 ENCOUNTER — Ambulatory Visit: Payer: PRIVATE HEALTH INSURANCE | Admitting: Podiatry

## 2022-03-01 ENCOUNTER — Ambulatory Visit (INDEPENDENT_AMBULATORY_CARE_PROVIDER_SITE_OTHER): Payer: 59

## 2022-03-01 ENCOUNTER — Ambulatory Visit (INDEPENDENT_AMBULATORY_CARE_PROVIDER_SITE_OTHER): Payer: PRIVATE HEALTH INSURANCE | Admitting: Podiatry

## 2022-03-01 DIAGNOSIS — Z9889 Other specified postprocedural states: Secondary | ICD-10-CM

## 2022-03-01 DIAGNOSIS — M21612 Bunion of left foot: Secondary | ICD-10-CM

## 2022-03-01 DIAGNOSIS — M21619 Bunion of unspecified foot: Secondary | ICD-10-CM

## 2022-03-01 DIAGNOSIS — Z969 Presence of functional implant, unspecified: Secondary | ICD-10-CM

## 2022-03-08 NOTE — Progress Notes (Signed)
Subjective: Chief Complaint  Patient presents with   Routine Post Op    Hardwire (k-wire) removal, sutures removed and steristrips applied over incision site.    Victoria Burch is a 46 y.o. is seen today in office s/p removal of hardware left foot preformed on 02/15/2022.  States that her pain is improving.  The offloading shoe.  Denies any systemic complaints such as fevers, chills, nausea, vomiting. No calf pain, chest pain, shortness of breath.   Objective: General: No acute distress, AAOx3  DP/PT pulses palpable 2/4, CRT < 3 sec to all digits.  Protective sensation intact. Motor function intact.  Left foot: Incision is well coapted without any evidence of dehiscence with sutures intact. There is no surrounding erythema, ascending cellulitis, fluctuance, crepitus, malodor, drainage/purulence. There is minimal edema around the surgical site. There is minimal pain along the surgical site.  No other areas of tenderness to bilateral lower extremities.  No other open lesions or pre-ulcerative lesions.  No pain with calf compression, swelling, warmth, erythema.   Assessment and Plan:  Status post left foot HWR, doing well with no complications   -Treatment options discussed including all alternatives, risks, and complications -X-rays obtained reviewed.  No evidence of acute fracture.  Radiolucency noted still present the dorsal cortex.  No evidence of acute fracture.  -Swelling actually is improving compared to what it was prior to the procedure. -Sutures removed today without complications.  Incisions well coapted. -Ice/elevation -Pain medication as needed. -Monitor for any clinical signs or symptoms of infection and DVT/PE and directed to call the office immediately should any occur or go to the ER. -Follow-up as scheduled or sooner if any problems arise. In the meantime, encouraged to call the office with any questions, concerns, change in symptoms.   Celesta Gentile, DPM

## 2022-03-14 ENCOUNTER — Other Ambulatory Visit: Payer: PRIVATE HEALTH INSURANCE

## 2022-03-18 ENCOUNTER — Ambulatory Visit (INDEPENDENT_AMBULATORY_CARE_PROVIDER_SITE_OTHER): Payer: 59

## 2022-03-18 DIAGNOSIS — Z969 Presence of functional implant, unspecified: Secondary | ICD-10-CM

## 2022-03-18 NOTE — Progress Notes (Signed)
POV #2: Hardwire (k-wire) removal, sutures removed right foot.   Patient in office today for POV #2. Patient denies nausea, vomiting, fever, chills, and pain. Patient reports she still is expecting some swelling in the toes.   Advised patient to continue to wear a compression sleeve, ice the foot and elevate. Patient verbalized understanding. Patient is wearing a supportive athletic shoe at this time.   Advised patient to call the office if she has any questions, comments or concerns. Patient verbalized understanding.   Patient to follow-up with Dr. Jacqualyn Posey for POV #3 in 2 weeks.

## 2022-03-31 ENCOUNTER — Ambulatory Visit (INDEPENDENT_AMBULATORY_CARE_PROVIDER_SITE_OTHER): Payer: 59

## 2022-03-31 ENCOUNTER — Ambulatory Visit (INDEPENDENT_AMBULATORY_CARE_PROVIDER_SITE_OTHER): Payer: 59 | Admitting: Podiatry

## 2022-03-31 DIAGNOSIS — Z9889 Other specified postprocedural states: Secondary | ICD-10-CM

## 2022-03-31 DIAGNOSIS — M21619 Bunion of unspecified foot: Secondary | ICD-10-CM

## 2022-03-31 NOTE — Progress Notes (Signed)
Subjective: Chief Complaint  Patient presents with   Routine Post Op    Left foot hard ware removal, patient denies any N/V/F/C/SOB, Rate of pain 6 out of 10, Burning ache, pulling     46 year old female presents for above concerns.  Pulling sensation to the side of the toe, pointing to the lateral. Feels like a fire poking to the side medially intermittently. Pins and needle sensation at time. Getting brusing sensation under the ball of the 2nd and 3rd toe.  No injuries that she reports.  She has questions in regards to the x-rays today.  No other concerns.  Objective: General: No acute distress, AAOx3  DP/PT pulses palpable 2/4, CRT < 3 sec to all digits.  Protective sensation intact. Motor function intact.  Left foot: Incision is well coapted without any evidence of dehiscence and scar is formed.  There is slight edema there is no erythema or warmth.  There is mild discomfort on exam.  Mild restriction of first MPJ range of motion.  No other areas of pinpoint tenderness.   No other open lesions or pre-ulcerative lesions.  No pain with calf compression, swelling, warmth, erythema.   Assessment and Plan:  Status post left foot HWR, neuritis  -Treatment options discussed including all alternatives, risks, and complications -X-rays were obtained and reviewed with the patient.  3 views were obtained.  Hardware intact.  Hardware is not prominent. -Extensive discussion with the patient regards the x-rays and answered all her questions.  At this time she is back to her regular shoe discussed range of motion activities as well as gradual increase activity level as tolerated.  Continue ice, elevate.  Compression seem to bother her more so can hold off on this.  I do expect the nerve symptoms to improve over time.  [name]

## 2022-05-12 ENCOUNTER — Ambulatory Visit (INDEPENDENT_AMBULATORY_CARE_PROVIDER_SITE_OTHER): Payer: 59 | Admitting: Podiatry

## 2022-05-12 DIAGNOSIS — Z9889 Other specified postprocedural states: Secondary | ICD-10-CM

## 2022-05-12 DIAGNOSIS — M21619 Bunion of unspecified foot: Secondary | ICD-10-CM

## 2022-05-12 NOTE — Progress Notes (Signed)
Subjective: Chief Complaint  Patient presents with   Foot Problem    Patient came in today for a left foot hardware removal follow-up, patient states that she is having some nerve pain, tingling and shooting , rate of pain 5 out of 10, but patient states she is doing much better    45 year old female presents for above concerns.  Overall she states that she is feeling better.  She is having more tingling, nerve type symptoms to her foot but overall she just feels getting better.  She is back to her regular shoe and she is doing more walking.  No fevers or chills.  Objective: General: No acute distress, AAOx3  DP/PT pulses palpable 2/4, CRT < 3 sec to all digits.  Protective sensation intact. Motor function intact.  Left foot: Incision is well coapted without any evidence of dehiscence and scar has formed.  There is still some trace edema there is no erythema or warmth.  No tenderness palpation surrounding surgical site.  Range of motion intact of the first MPJ. No pain with calf compression, swelling, warmth, erythema.   Assessment and Plan:  Status post left foot HWR, neuritis  -Treatment options discussed including all alternatives, risks, and complications -Overall she is continue to improve.  We discussed different range of motion exercises for the first MPJ.  Continue shoes and good arch support.  I do expect the nerve symptoms to continue to improve but discussed it can take some time for this to resolve.  Hopefully with continued range of motion, the sensation also improved.  [name]

## 2022-05-12 NOTE — Patient Instructions (Signed)
For the nerve symptoms you can start a B-COMPLEX VITAMIN and ALPHA-LIPOIC ACID

## 2022-05-14 ENCOUNTER — Encounter: Payer: Self-pay | Admitting: Podiatry

## 2022-06-01 ENCOUNTER — Other Ambulatory Visit: Payer: Self-pay | Admitting: Family Medicine

## 2022-06-01 ENCOUNTER — Ambulatory Visit
Admission: RE | Admit: 2022-06-01 | Discharge: 2022-06-01 | Disposition: A | Discharge status: Home / Self Care | Payer: 59 | Source: Ambulatory Visit | Attending: Family Medicine | Admitting: Family Medicine

## 2022-06-01 ENCOUNTER — Encounter: Payer: Self-pay | Admitting: Podiatry

## 2022-06-01 DIAGNOSIS — R921 Mammographic calcification found on diagnostic imaging of breast: Secondary | ICD-10-CM

## 2022-06-10 ENCOUNTER — Encounter: Payer: Self-pay | Admitting: Family Medicine

## 2022-06-10 ENCOUNTER — Ambulatory Visit (INDEPENDENT_AMBULATORY_CARE_PROVIDER_SITE_OTHER): Payer: 59 | Admitting: Family Medicine

## 2022-06-10 VITALS — BP 124/80 | HR 88 | Temp 98.9°F | Resp 17 | Ht 61.0 in | Wt 161.5 lb

## 2022-06-10 DIAGNOSIS — Z0000 Encounter for general adult medical examination without abnormal findings: Secondary | ICD-10-CM

## 2022-06-10 DIAGNOSIS — E559 Vitamin D deficiency, unspecified: Secondary | ICD-10-CM

## 2022-06-10 DIAGNOSIS — E669 Obesity, unspecified: Secondary | ICD-10-CM | POA: Diagnosis not present

## 2022-06-10 LAB — BASIC METABOLIC PANEL
BUN: 16 mg/dL (ref 6–23)
CO2: 27 mEq/L (ref 19–32)
Calcium: 9.7 mg/dL (ref 8.4–10.5)
Chloride: 101 mEq/L (ref 96–112)
Creatinine, Ser: 0.68 mg/dL (ref 0.40–1.20)
GFR: 105.84 mL/min (ref >60.00)
Glucose, Bld: 102 mg/dL — ABNORMAL HIGH (ref 70–99)
Potassium: 3.9 mEq/L (ref 3.5–5.1)
Sodium: 138 mEq/L (ref 135–145)

## 2022-06-10 LAB — CBC WITH DIFFERENTIAL/PLATELET
Basophils Absolute: 0 10*3/uL (ref 0.0–0.1)
Basophils Relative: 0.8 % (ref 0.0–3.0)
Eosinophils Absolute: 0.2 10*3/uL (ref 0.0–0.7)
Eosinophils Relative: 4 % (ref 0.0–5.0)
HCT: 42.1 % (ref 36.0–46.0)
Hemoglobin: 14.4 g/dL (ref 12.0–15.0)
Lymphocytes Relative: 31.5 % (ref 12.0–46.0)
Lymphs Abs: 1.5 10*3/uL (ref 0.7–4.0)
MCHC: 34.2 g/dL (ref 30.0–36.0)
MCV: 87.2 fl (ref 78.0–100.0)
Monocytes Absolute: 0.4 10*3/uL (ref 0.1–1.0)
Monocytes Relative: 9.1 % (ref 3.0–12.0)
Neutro Abs: 2.7 10*3/uL (ref 1.4–7.7)
Neutrophils Relative %: 54.6 % (ref 43.0–77.0)
Platelets: 196 10*3/uL (ref 150.0–400.0)
RBC: 4.83 Mil/uL (ref 3.87–5.11)
RDW: 13.1 % (ref 11.5–15.5)
WBC: 4.9 10*3/uL (ref 4.0–10.5)

## 2022-06-10 LAB — HEPATIC FUNCTION PANEL
ALT: 38 U/L — ABNORMAL HIGH (ref 0–35)
AST: 30 U/L (ref 0–37)
Albumin: 4.6 g/dL (ref 3.5–5.2)
Alkaline Phosphatase: 69 U/L (ref 39–117)
Bilirubin, Direct: 0.1 mg/dL (ref 0.0–0.3)
Total Bilirubin: 0.6 mg/dL (ref 0.2–1.2)
Total Protein: 7 g/dL (ref 6.0–8.3)

## 2022-06-10 LAB — LIPID PANEL
Cholesterol: 170 mg/dL (ref 0–200)
HDL: 75.4 mg/dL (ref >39.00)
LDL Cholesterol: 80 mg/dL (ref 0–99)
NonHDL: 95.06
Total CHOL/HDL Ratio: 2
Triglycerides: 76 mg/dL (ref 0.0–149.0)
VLDL: 15.2 mg/dL (ref 0.0–40.0)

## 2022-06-10 LAB — TSH: TSH: 1.72 u[IU]/mL (ref 0.35–5.50)

## 2022-06-10 LAB — VITAMIN D 25 HYDROXY (VIT D DEFICIENCY, FRACTURES): VITD: 25.29 ng/mL — ABNORMAL LOW (ref 30.00–100.00)

## 2022-06-10 MED ORDER — MONTELUKAST SODIUM 10 MG PO TABS: 10.0000 mg | ORAL_TABLET | Freq: Every day | ORAL | 3 refills | Status: DC

## 2022-06-10 NOTE — Assessment & Plan Note (Signed)
New.  Pt has gained 6 lbs since last visit.  Encouraged low carb/low sugar diet and regular exercise.  Check labs to risk stratify.  Will follow.

## 2022-06-10 NOTE — Progress Notes (Signed)
   Subjective:    Patient ID: Victoria Burch, female    DOB: March 22, 1976, 46 y.o.   MRN: [id number]  HPI CPE- UTD on Tdap, mammo.  No need for pap due to hysterectomy  Patient Care Team    Relationship Specialty Notifications Start End  Midge Minium, MD PCP - General Family Medicine  01/15/16   Paula Compton, MD Consulting Physician Obstetrics and Gynecology  01/20/17     Health Maintenance  Topic Date Due   DTaP/Tdap/Td (3 - Td or Tdap) 08/26/2025   INFLUENZA VACCINE  Completed   HIV Screening  Completed   HPV VACCINES  Aged Out   COVID-19 Vaccine  Discontinued   Hepatitis C Screening  Discontinued      Review of Systems Patient reports no vision/ hearing changes, adenopathy, fever, persistant/recurrent hoarseness , swallowing issues, chest pain, palpitations, edema, hemoptysis, dyspnea (rest/exertional/paroxysmal nocturnal), gastrointestinal bleeding (melena, rectal bleeding), abdominal pain, significant heartburn,  GU symptoms (dysuria, hematuria, incontinence), Gyn symptoms (abnormal  bleeding, pain),  syncope, focal weakness, memory loss, numbness & tingling, skin/hair/nail changes, abnormal bruising or bleeding, anxiety, or depression.   + 6 lb weight gain + cough- since Halloween.  Treated w/ abx and steroids.  Currently on Zyrtec and Flonase. + intermittent abd distention and swelling, when this occurs she has 'skinny poop'.    Objective:   Physical Exam General Appearance:    Alert, cooperative, no distress, appears stated age  Head:    Normocephalic, without obvious abnormality, atraumatic  Eyes:    PERRL, conjunctiva/corneas clear, EOM's intact both eyes  Ears:    Normal TM's and external ear canals, both ears  Nose:   Nares normal, septum midline, mucosa normal, no drainage    or sinus tenderness  Throat:   Lips, mucosa, and tongue normal; teeth and gums normal  Neck:   Supple, symmetrical, trachea midline, no adenopathy;    Thyroid: no  enlargement/tenderness/nodules  Back:     Symmetric, no curvature, ROM normal, no CVA tenderness  Lungs:     Clear to auscultation bilaterally, respirations unlabored  Chest Wall:    No tenderness or deformity   Heart:    Regular rate and rhythm, S1 and S2 normal, no murmur, rub   or gallop  Breast Exam:    Deferred to GYN  Abdomen:     Soft, non-tender, bowel sounds active all four quadrants,    no masses, no organomegaly  Genitalia:    Deferred to GYN  Rectal:    Extremities:   Extremities normal, atraumatic, no cyanosis or edema  Pulses:   2+ and symmetric all extremities  Skin:   Skin color, texture, turgor normal, no rashes or lesions  Lymph nodes:   Cervical, supraclavicular, and axillary nodes normal  Neurologic:   CNII-XII intact, normal strength, sensation and reflexes    throughout          Assessment & Plan:

## 2022-06-10 NOTE — Assessment & Plan Note (Signed)
Pt has hx of this.  Check labs and replete prn. 

## 2022-06-10 NOTE — Assessment & Plan Note (Signed)
Pt's PE WNL w/ exception of BMI.  UTD on Tdap, mammo.  Check labs.  Anticipatory guidance provided.

## 2022-06-10 NOTE — Patient Instructions (Signed)
Follow up in 1 year or as needed We'll notify you of your lab results and make any changes if needed Continue to work on healthy diet and regular exercise- you can do it!!! Add the Montelukast (Singulair) nightly to help w/ mucous and drainage Continue to take Miralax daily and increase water intake If mood doesn't improve, let me know Call with any questions or concerns Stay Safe!  Stay Healthy! Happy Holidays!!!

## 2022-06-13 ENCOUNTER — Other Ambulatory Visit: Payer: Self-pay

## 2022-06-13 ENCOUNTER — Telehealth: Payer: Self-pay

## 2022-06-13 DIAGNOSIS — E559 Vitamin D deficiency, unspecified: Secondary | ICD-10-CM

## 2022-06-13 MED ORDER — VITAMIN D (ERGOCALCIFEROL) 1.25 MG (50000 UNIT) PO CAPS: 50000.0000 [IU] | ORAL_CAPSULE | ORAL | 12 refills | Status: DC

## 2022-06-13 NOTE — Telephone Encounter (Signed)
Informed pt of lab results and vit d 50,000 has been sent to pharmacy

## 2022-06-13 NOTE — Telephone Encounter (Signed)
-----   Message from Midge Minium, MD sent at 06/12/2022  8:08 PM EST ----- Labs look great w/ exception of mildly low Vit D.  Based on this, we need to start 50,000 units weekly x12 weeks in addition to daily OTC supplement of at least 2000 units.

## 2022-07-04 ENCOUNTER — Encounter: Payer: Self-pay | Admitting: Podiatry

## 2022-07-08 ENCOUNTER — Ambulatory Visit: Payer: 59 | Admitting: Podiatry

## 2022-09-05 ENCOUNTER — Other Ambulatory Visit: Payer: Self-pay | Admitting: Family Medicine

## 2022-09-12 ENCOUNTER — Encounter: Payer: Self-pay | Admitting: Family Medicine

## 2022-09-12 ENCOUNTER — Ambulatory Visit (INDEPENDENT_AMBULATORY_CARE_PROVIDER_SITE_OTHER): Payer: Managed Care, Other (non HMO) | Admitting: Family Medicine

## 2022-09-12 VITALS — BP 120/80 | HR 95 | Temp 97.9°F | Resp 16 | Ht 61.0 in | Wt 158.5 lb

## 2022-09-12 DIAGNOSIS — K2100 Gastro-esophageal reflux disease with esophagitis, without bleeding: Secondary | ICD-10-CM

## 2022-09-12 LAB — HEPATIC FUNCTION PANEL
ALT: 17 U/L (ref 0–35)
AST: 18 U/L (ref 0–37)
Albumin: 4.5 g/dL (ref 3.5–5.2)
Alkaline Phosphatase: 63 U/L (ref 39–117)
Bilirubin, Direct: 0.1 mg/dL (ref 0.0–0.3)
Total Bilirubin: 0.4 mg/dL (ref 0.2–1.2)
Total Protein: 6.7 g/dL (ref 6.0–8.3)

## 2022-09-12 LAB — CBC WITH DIFFERENTIAL/PLATELET
Basophils Absolute: 0 10*3/uL (ref 0.0–0.1)
Basophils Relative: 0.9 % (ref 0.0–3.0)
Eosinophils Absolute: 0.1 10*3/uL (ref 0.0–0.7)
Eosinophils Relative: 4 % (ref 0.0–5.0)
HCT: 41.6 % (ref 36.0–46.0)
Hemoglobin: 13.9 g/dL (ref 12.0–15.0)
Lymphocytes Relative: 37.1 % (ref 12.0–46.0)
Lymphs Abs: 1.4 10*3/uL (ref 0.7–4.0)
MCHC: 33.4 g/dL (ref 30.0–36.0)
MCV: 87.8 fl (ref 78.0–100.0)
Monocytes Absolute: 0.4 10*3/uL (ref 0.1–1.0)
Monocytes Relative: 9.7 % (ref 3.0–12.0)
Neutro Abs: 1.8 10*3/uL (ref 1.4–7.7)
Neutrophils Relative %: 48.3 % (ref 43.0–77.0)
Platelets: 227 10*3/uL (ref 150.0–400.0)
RBC: 4.74 Mil/uL (ref 3.87–5.11)
RDW: 13.7 % (ref 11.5–15.5)
WBC: 3.7 10*3/uL — ABNORMAL LOW (ref 4.0–10.5)

## 2022-09-12 LAB — H. PYLORI ANTIBODY, IGG: H Pylori IgG: NEGATIVE

## 2022-09-12 LAB — LIPASE: Lipase: 9 U/L — ABNORMAL LOW (ref 11.0–59.0)

## 2022-09-12 MED ORDER — PANTOPRAZOLE SODIUM 40 MG PO TBEC: 40.0000 mg | DELAYED_RELEASE_TABLET | Freq: Every day | ORAL | 3 refills | Status: DC

## 2022-09-12 MED ORDER — ONDANSETRON HCL 4 MG PO TABS: 4.0000 mg | ORAL_TABLET | Freq: Three times a day (TID) | ORAL | 0 refills | Status: DC | PRN

## 2022-09-12 MED ORDER — SUCRALFATE 1 G PO TABS: 1.0000 g | ORAL_TABLET | Freq: Three times a day (TID) | ORAL | 0 refills | Status: DC

## 2022-09-12 NOTE — Patient Instructions (Addendum)
Follow up in 1 month to recheck abdominal pain We'll notify you of your lab results and make any changes if needed START the Pantoprazole daily TAKE the Carafate prior to meals and before bed USE the Zofran as needed for nausea Try and limit spicy and acidic foods- including caffeine and alcohol Call with any questions or concerns Stay Safe!  Stay Healthy!

## 2022-09-12 NOTE — Progress Notes (Signed)
   Subjective:    Patient ID: Victoria Burch, female    DOB: 14-May-1976, 47 y.o.   MRN: [id number]  HPI Abd pain- sxs started ~1 week ago w/ 'indigestion'.  Would eat and felt like it would 'just get stuck'.  Eating makes her nauseous.  Has tried Tums, Pepto, Prilosec OTC.  Prilosec has been the most help.  Spent 2 nights sleeping in a recliner due to 'burning in my chest'.  Feels bloated, distended, and very uncomfortable.  No dietary changes, always has fairly high stress level but no dramatic change.  No recent abx or illness.     Review of Systems For ROS see HPI     Objective:   Physical Exam Vitals reviewed.  Constitutional:      General: She is not in acute distress.    Appearance: She is well-developed. She is not ill-appearing.  HENT:     Head: Normocephalic and atraumatic.  Cardiovascular:     Rate and Rhythm: Normal rate and regular rhythm.  Pulmonary:     Effort: Pulmonary effort is normal. No respiratory distress.  Abdominal:     General: Bowel sounds are normal. There is distension (mild).     Palpations: Abdomen is soft.     Tenderness: There is abdominal tenderness in the epigastric area. There is no guarding or rebound.  Skin:    General: Skin is warm and dry.  Neurological:     General: No focal deficit present.     Mental Status: She is alert and oriented to person, place, and time.  Psychiatric:        Mood and Affect: Mood normal.        Behavior: Behavior normal.           Assessment & Plan:

## 2022-09-12 NOTE — Assessment & Plan Note (Signed)
New.  Pt's sxs are consistent w/ esophagitis, possible early gastritis.  Will check labs to r/o other causes of pain and H pylori but will start Protonix 40mg  daily as well as Carafate to allow the esophageal and gastric linings to heal.  Zofran as needed for nausea.  If no improvement in a few weeks will refer to GI for complete evaluation.  Pt expressed understanding and is in agreement w/ plan.

## 2022-09-13 ENCOUNTER — Telehealth: Payer: Self-pay

## 2022-09-13 NOTE — Telephone Encounter (Signed)
-----   Message from Midge Minium, MD sent at 09/13/2022  7:19 AM EDT ----- Labs look great!  No evidence of H pylori (ulcer causing bacteria).  I suspect this is all acid mediated and we'll get you feeling better shortly.

## 2022-09-13 NOTE — Telephone Encounter (Signed)
Left lab results on pt VM 

## 2022-09-16 ENCOUNTER — Ambulatory Visit: Payer: Managed Care, Other (non HMO) | Admitting: Podiatry

## 2022-09-16 ENCOUNTER — Ambulatory Visit (INDEPENDENT_AMBULATORY_CARE_PROVIDER_SITE_OTHER): Payer: Managed Care, Other (non HMO)

## 2022-09-16 DIAGNOSIS — M21619 Bunion of unspecified foot: Secondary | ICD-10-CM | POA: Diagnosis not present

## 2022-09-16 DIAGNOSIS — Z9889 Other specified postprocedural states: Secondary | ICD-10-CM | POA: Diagnosis not present

## 2022-09-16 NOTE — Progress Notes (Unsigned)
Subjective: Chief Complaint  Patient presents with   Post-op Follow-up    Post op #3 2 mo surgical f/u, pain located at the great hallux area anterior and posterior and radiates to the ball of foot      47 year old female presents for above concerns.  She said that she said it is sharp pain pointing on the dorsal IPJ area.  She does feel that she is still improving though.no recent injury or changes.  She said that she is not able to feel the wire.   Objective: General: No acute distress, AAOx3  DP/PT pulses palpable 2/4, CRT < 3 sec to all digits.  Protective sensation intact. Motor function intact.  Left foot: Incision is well coapted without any evidence of dehiscence and scar has formed.  I am not able to palpate the hardware.  No pain with MPJ range of motion but discomfort is localized mostly along the first MPJ dorsally. No pain with calf compression, swelling, warmth, erythema.   Assessment and Plan:  Status post left foot HWR, neuritis  -Treatment options discussed including all alternatives, risks, and complications -X-rays obtained reviewed.  3 views of the foot were obtained.  Hardware intact.  Some slight shifting of the wire but not able to palpate this clinically.  Osteotomy is healed. -We discussed continued range of motion exercises as well as a regular shoe as tolerated.  Continue ice, elevate if needed.  Discussed long-term care with wire for any developing of causing issues at this time we discussed in the future should she continue to have symptoms we can remove this.  [name]

## 2022-09-30 ENCOUNTER — Encounter: Payer: Self-pay | Admitting: Family Medicine

## 2022-10-12 ENCOUNTER — Ambulatory Visit: Payer: Managed Care, Other (non HMO) | Admitting: Family Medicine

## 2022-10-12 ENCOUNTER — Encounter: Payer: Self-pay | Admitting: Family Medicine

## 2022-10-12 VITALS — BP 116/78 | HR 86 | Temp 98.6°F | Resp 17 | Ht 61.0 in | Wt 163.5 lb

## 2022-10-12 DIAGNOSIS — F419 Anxiety disorder, unspecified: Secondary | ICD-10-CM | POA: Diagnosis not present

## 2022-10-12 DIAGNOSIS — K2100 Gastro-esophageal reflux disease with esophagitis, without bleeding: Secondary | ICD-10-CM

## 2022-10-12 DIAGNOSIS — M542 Cervicalgia: Secondary | ICD-10-CM

## 2022-10-12 NOTE — Patient Instructions (Signed)
Follow up as needed or as scheduled CONTINUE the Pantoprazole daily Let me know if you change your mind on medication Use tylenol as needed for aches/pains Voltaren gel as needed for pain Call with any questions or concerns Hang in there!  You've got this!!!

## 2022-10-12 NOTE — Assessment & Plan Note (Signed)
Improved on Protonix but does still have breakthrough sxs.  She would prefer to not take medication regularly but notes sxs return when she stops meds.  Will continue daily PPI at this time.

## 2022-10-12 NOTE — Progress Notes (Signed)
   Subjective:    Patient ID: Victoria Burch, female    DOB: [birth year], 47 y.o.   MRN: [id number]  HPI GERD- at last visit pt was started on Pantoprazole  daily, Carafate TID prn, and Zofran for nausea.  Continues to feel bloated but 'not as much'.  Pt states protonix has helped.  Sxs return when she stops taking it.    Neck and shoulder pain- pt repots intermittent for 'a couple of years'.  Sxs are worse now than before.  Not taking ibuprofen.    High stress level- 2 teens at home, roof is leaking, camper needs to be fixed, declining health in parents.  Pt doesn't want to take medication at this time.  Has taken meds in the past and struggled w/ side effects.   Review of Systems For ROS see HPI     Objective:   Physical Exam Vitals reviewed.  Constitutional:      General: She is not in acute distress.    Appearance: Normal appearance. She is not ill-appearing.  HENT:     Head: Normocephalic and atraumatic.  Eyes:     Extraocular Movements: Extraocular movements intact.     Conjunctiva/sclera: Conjunctivae normal.     Pupils: Pupils are equal, round, and reactive to light.  Cardiovascular:     Rate and Rhythm: Normal rate and regular rhythm.  Pulmonary:     Effort: Pulmonary effort is normal. No respiratory distress.  Skin:    General: Skin is warm and dry.  Neurological:     General: No focal deficit present.     Mental Status: She is alert and oriented to person, place, and time.  Psychiatric:        Mood and Affect: Mood normal.        Behavior: Behavior normal.        Thought Content: Thought content normal.           Assessment & Plan:  Neck pain- new.  Pt's description of pain is consistent w/ muscle tightness- likely stress.  Encouraged Tylenol as needed and Voltaren gel.  Discussed benefit of massage and/or PT.  Pt to think about the latter.

## 2022-10-12 NOTE — Assessment & Plan Note (Signed)
New.  Pt is under considerable stress and feels that her physical sxs are a manifestation of her high stress level.  I asked if she was interested in medication to help w/ her anxiety but at this time she prefers not to start something.  Discussed that there is no extra credit for suffering and she can let me know if she changes her mind.  She is worried about being on something that causes her to feel 'weird' or have side effects.  Told her there are lots of options we could try if needed.  Will follow.

## 2022-11-16 ENCOUNTER — Ambulatory Visit
Admission: RE | Admit: 2022-11-16 | Discharge: 2022-11-16 | Disposition: A | Discharge status: Home / Self Care | Payer: Managed Care, Other (non HMO) | Source: Ambulatory Visit | Attending: Family Medicine | Admitting: Family Medicine

## 2022-11-16 DIAGNOSIS — R921 Mammographic calcification found on diagnostic imaging of breast: Secondary | ICD-10-CM

## 2022-11-23 ENCOUNTER — Encounter: Payer: Self-pay | Admitting: Family Medicine

## 2022-12-13 ENCOUNTER — Other Ambulatory Visit: Payer: Self-pay | Admitting: Family Medicine

## 2022-12-13 LAB — HM PAP SMEAR: HM Pap smear: NORMAL

## 2022-12-15 DIAGNOSIS — M79671 Pain in right foot: Secondary | ICD-10-CM | POA: Insufficient documentation

## 2022-12-16 ENCOUNTER — Encounter: Payer: Self-pay | Admitting: Family Medicine

## 2023-02-17 ENCOUNTER — Encounter: Payer: Self-pay | Admitting: Podiatry

## 2023-03-10 ENCOUNTER — Ambulatory Visit: Payer: Managed Care, Other (non HMO) | Admitting: Podiatry

## 2023-03-10 DIAGNOSIS — M7751 Other enthesopathy of right foot: Secondary | ICD-10-CM | POA: Diagnosis not present

## 2023-03-10 DIAGNOSIS — M7671 Peroneal tendinitis, right leg: Secondary | ICD-10-CM | POA: Diagnosis not present

## 2023-03-10 NOTE — Patient Instructions (Signed)
Ankle Sprain, Phase I Rehab An ankle sprain is an injury to the tissues that connect bone to bone (ligaments) in your ankle. Ankle sprains can cause stiffness, loss of motion, and loss of strength. Ask your health care provider which exercises are safe for you. Do exercises exactly as told by your provider and adjust them as directed. It is normal to feel mild stretching, pulling, tightness, or discomfort as you do these exercises. Stop right away if you feel sudden pain or your pain gets worse. Do not begin these exercises until told by your provider. Stretching and range-of-motion exercises These exercises warm up your muscles and joints. They can improve the movement and flexibility of your lower leg and ankle. They also help to relieve pain and stiffness. Gastroc and soleus stretch This exercise is also called a calf stretch. It stretches the muscles in the back of the lower leg. These muscles are the gastrocnemius, or gastroc, and the soleus. Sit on the floor with your left / right leg extended. Loop a belt or towel around the ball of your left / right foot. The ball of your foot is on the walking surface, right under your toes. Keep your left / right ankle and foot relaxed and keep your knee straight. Use the belt or towel to pull your foot toward you. You should feel a gentle stretch behind your calf or knee in your gastroc muscle. Hold this position for __________ seconds, then release to the starting position. Repeat the exercise with your knee bent. You can put a pillow or a rolled bath towel under your knee to support it. You should feel a stretch deep in your calf in the soleus muscle or at your Achilles tendon. Repeat __________ times. Complete this exercise __________ times a day. Ankle alphabet  Sit with your left / right leg supported at the lower leg. Do not rest your foot on anything. Make sure your foot has room to move freely. Think of your left / right foot as a  paintbrush. Move your foot to trace each letter of the alphabet in the air. Keep your hip and knee still while you trace. Make the letters as large as you can without feeling discomfort. Trace every letter from A to Z. Repeat __________ times. Complete this exercise __________ times a day. Strengthening exercises These exercises build strength and endurance in your ankle and lower leg. Endurance is the ability to use your muscles for a long time, even after they get tired. Ankle dorsiflexion  Secure a rubber exercise band or tube to an object, such as a table leg, that will stay still when the band is pulled. Secure the other end around your left / right foot. Sit on the floor facing the object, with your left / right leg extended. The band or tube should be slightly tense when your foot is relaxed. Slowly bring your foot toward you, bringing the top of your foot toward your shin (dorsiflexion), and pulling the band tighter. Hold this position for __________ seconds. Slowly return your foot to the starting position. Repeat __________ times. Complete this exercise __________ times a day. Ankle plantar flexion  Sit on the floor with your left / right leg extended. Loop a rubber exercise tube or band around the ball of your left / right foot. The ball of your foot is on the walking surface, right under your toes. Hold the ends of the band or tube in your hands. The band or tube should be  slightly tense when your foot is relaxed. Slowly point your foot and toes downward to tilt the top of your foot away from your shin (plantar flexion). Hold this position for __________ seconds. Slowly return your foot to the starting position. Repeat __________ times. Complete this exercise __________ times a day. Ankle eversion  Sit on the floor with your legs straight out in front of you. Loop a rubber exercise band or tube around the ball of your left / right foot. The ball of your foot is on the walking  surface, right under your toes. Hold the ends of the band in your hands or secure the band to a stable object. The band or tube should be slightly tense when your foot is relaxed. Slowly push your foot outward, away from your other leg (eversion). Hold this position for __________ seconds. Slowly return your foot to the starting position. Repeat __________ times. Complete this exercise __________ times a day. This information is not intended to replace advice given to you by your health care provider. Make sure you discuss any questions you have with your health care provider. Document Revised: 04/06/2022 Document Reviewed: 04/06/2022 Elsevier Patient Education  2024 [employer].

## 2023-03-10 NOTE — Progress Notes (Unsigned)
Subjective: Chief Complaint  Patient presents with   Foot Pain    Sharp intermittent pain along lateral side of right ankle.   47 year old female presents the office today with concerns of right lateral ankle discomfort.  She says she was pushing when she stepped on a gumball and caused her to fall.  After the week she saw a piece that she was seen [facility] and x-rays performed.  She was told do some exercises but she felt a popping sensation so she stopped doing this.  She states that she gets discomfort mostly going up and down steps or uneven surfaces will be sore.  She cannot wear anything other than sneakers or flats but anything with the heel causes discomfort.  No swelling now or at the time of the injury.  She states the pain is getting better currently.  Objective: AAO x3, NAD DP/PT pulses palpable bilaterally, CRT less than 3 seconds Majority discomfort is localized along the posterior aspect of the fibula along the course of the peroneal tendon.  Clinically the tendon appears to be intact.  There is mild discomfort with eversion of the foot.  There is no edema.  There is no area pinpoint tenderness identified at this time.  There is no significant ankle instability identified.  There is tenderness along the sinus tarsi.  No other areas of discomfort. No pain with calf compression, swelling, warmth, erythema  Assessment: 47 year old female capsulitis, peroneal tendinitis  Plan: -All treatment options discussed with the patient including all alternatives, risks, complications.  -I independently reviewed the x-rays that she brought in from [facility].  No subacute fracture. -Continue with supportive shoes.  We discussed getting back into stretching, rehab exercises but start gradually increasing them as tolerated.  Icing as well.  We discussed MRI but her symptoms are already starting improving we will continue this treatment but symptoms persist or worsen we will get  the MRI. -Patient encouraged to call the office with any questions, concerns, change in symptoms.    Vivi Barrack DPM

## 2023-03-14 ENCOUNTER — Other Ambulatory Visit: Payer: Self-pay | Admitting: Family Medicine

## 2023-03-16 ENCOUNTER — Ambulatory Visit: Payer: Managed Care, Other (non HMO) | Admitting: Podiatry

## 2023-04-02 ENCOUNTER — Other Ambulatory Visit: Payer: Self-pay | Admitting: Family Medicine

## 2023-06-12 ENCOUNTER — Ambulatory Visit (INDEPENDENT_AMBULATORY_CARE_PROVIDER_SITE_OTHER): Payer: Managed Care, Other (non HMO) | Admitting: Family Medicine

## 2023-06-12 ENCOUNTER — Encounter: Payer: Self-pay | Admitting: Family Medicine

## 2023-06-12 VITALS — BP 108/68 | HR 88 | Temp 97.8°F | Ht 61.0 in | Wt 165.1 lb

## 2023-06-12 DIAGNOSIS — K2100 Gastro-esophageal reflux disease with esophagitis, without bleeding: Secondary | ICD-10-CM

## 2023-06-12 DIAGNOSIS — E669 Obesity, unspecified: Secondary | ICD-10-CM

## 2023-06-12 DIAGNOSIS — Z0000 Encounter for general adult medical examination without abnormal findings: Secondary | ICD-10-CM

## 2023-06-12 DIAGNOSIS — Z114 Encounter for screening for human immunodeficiency virus [HIV]: Secondary | ICD-10-CM | POA: Diagnosis not present

## 2023-06-12 DIAGNOSIS — E559 Vitamin D deficiency, unspecified: Secondary | ICD-10-CM

## 2023-06-12 DIAGNOSIS — R1032 Left lower quadrant pain: Secondary | ICD-10-CM

## 2023-06-12 DIAGNOSIS — Z1159 Encounter for screening for other viral diseases: Secondary | ICD-10-CM

## 2023-06-12 LAB — VITAMIN D 25 HYDROXY (VIT D DEFICIENCY, FRACTURES): VITD: 62.96 ng/mL (ref 30.00–100.00)

## 2023-06-12 LAB — HEPATIC FUNCTION PANEL
ALT: 27 U/L (ref 0–35)
AST: 24 U/L (ref 0–37)
Albumin: 4.5 g/dL (ref 3.5–5.2)
Alkaline Phosphatase: 64 U/L (ref 39–117)
Bilirubin, Direct: 0.1 mg/dL (ref 0.0–0.3)
Total Bilirubin: 0.4 mg/dL (ref 0.2–1.2)
Total Protein: 6.8 g/dL (ref 6.0–8.3)

## 2023-06-12 LAB — LIPID PANEL
Cholesterol: 152 mg/dL (ref 0–200)
HDL: 69.5 mg/dL (ref >39.00)
LDL Cholesterol: 57 mg/dL (ref 0–99)
NonHDL: 82.05
Total CHOL/HDL Ratio: 2
Triglycerides: 123 mg/dL (ref 0.0–149.0)
VLDL: 24.6 mg/dL (ref 0.0–40.0)

## 2023-06-12 LAB — CBC WITH DIFFERENTIAL/PLATELET
Basophils Absolute: 0 10*3/uL (ref 0.0–0.1)
Basophils Relative: 0.7 % (ref 0.0–3.0)
Eosinophils Absolute: 0.2 10*3/uL (ref 0.0–0.7)
Eosinophils Relative: 3.7 % (ref 0.0–5.0)
HCT: 41.4 % (ref 36.0–46.0)
Hemoglobin: 13.9 g/dL (ref 12.0–15.0)
Lymphocytes Relative: 32.4 % (ref 12.0–46.0)
Lymphs Abs: 1.4 10*3/uL (ref 0.7–4.0)
MCHC: 33.5 g/dL (ref 30.0–36.0)
MCV: 88.6 fL (ref 78.0–100.0)
Monocytes Absolute: 0.4 10*3/uL (ref 0.1–1.0)
Monocytes Relative: 8.6 % (ref 3.0–12.0)
Neutro Abs: 2.3 10*3/uL (ref 1.4–7.7)
Neutrophils Relative %: 54.6 % (ref 43.0–77.0)
Platelets: 211 10*3/uL (ref 150.0–400.0)
RBC: 4.67 Mil/uL (ref 3.87–5.11)
RDW: 13.7 % (ref 11.5–15.5)
WBC: 4.3 10*3/uL (ref 4.0–10.5)

## 2023-06-12 LAB — BASIC METABOLIC PANEL
BUN: 13 mg/dL (ref 6–23)
CO2: 28 meq/L (ref 19–32)
Calcium: 9.2 mg/dL (ref 8.4–10.5)
Chloride: 102 meq/L (ref 96–112)
Creatinine, Ser: 0.72 mg/dL (ref 0.40–1.20)
GFR: 100.89 mL/min (ref >60.00)
Glucose, Bld: 93 mg/dL (ref 70–99)
Potassium: 4 meq/L (ref 3.5–5.1)
Sodium: 138 meq/L (ref 135–145)

## 2023-06-12 LAB — TSH: TSH: 2.18 u[IU]/mL (ref 0.35–5.50)

## 2023-06-12 NOTE — Progress Notes (Signed)
   Subjective:    Patient ID: Victoria Burch, female    DOB: 02/23/1976, 47 y.o.   MRN: [id number]  HPI CPE- UTD on flu, Tdap, pap, colonoscopy, mammo  Patient Care Team    Relationship Specialty Notifications Start End  Sheliah Hatch, MD PCP - General Family Medicine  01/15/16   Huel Cote, MD Consulting Physician Obstetrics and Gynecology  01/20/17     Health Maintenance  Topic Date Due   HIV Screening  Never done   Hepatitis C Screening  Never done   INFLUENZA VACCINE  01/26/2023   COVID-19 Vaccine (4 - 2024-25 season) 02/26/2023   DTaP/Tdap/Td (3 - Td or Tdap) 08/26/2025   Colonoscopy  02/17/2027   HPV VACCINES  Aged Out      Review of Systems Patient reports no vision/ hearing changes, adenopathy,fever, weight change,  persistant/recurrent hoarseness , swallowing issues, chest pain, palpitations, edema, persistant/recurrent cough, hemoptysis, dyspnea (rest/exertional/paroxysmal nocturnal), gastrointestinal bleeding (melena, rectal bleeding), bowel changes, GU symptoms (dysuria, hematuria, incontinence), Gyn symptoms (abnormal  bleeding, pain),  syncope, focal weakness, memory loss, numbness & tingling, skin/hair/nail changes, abnormal bruising or bleeding, anxiety, or depression.     + intermittent LLQ pain- episodic for ~8 yrs.  When pain occurs, notes stools will be thin in diameter.  + GERD despite pantoprazole daily. Objective:   Physical Exam General Appearance:    Alert, cooperative, no distress, appears stated age  Head:    Normocephalic, without obvious abnormality, atraumatic  Eyes:    PERRL, conjunctiva/corneas clear, EOM's intact both eyes  Ears:    Normal TM's and external ear canals, both ears  Nose:   Nares normal, septum midline, mucosa normal, no drainage    or sinus tenderness  Throat:   Lips, mucosa, and tongue normal; teeth and gums normal  Neck:   Supple, symmetrical, trachea midline, no adenopathy;    Thyroid: no  enlargement/tenderness/nodules  Back:     Symmetric, no curvature, ROM normal, no CVA tenderness  Lungs:     Clear to auscultation bilaterally, respirations unlabored  Chest Wall:    No tenderness or deformity   Heart:    Regular rate and rhythm, S1 and S2 normal, no murmur, rub   or gallop  Breast Exam:    Deferred to GYN  Abdomen:     Soft, non-tender, bowel sounds active all four quadrants,    no masses, no organomegaly  Genitalia:    Deferred to GYN  Rectal:    Extremities:   Extremities normal, atraumatic, no cyanosis or edema  Pulses:   2+ and symmetric all extremities  Skin:   Skin color, texture, turgor normal, no rashes or lesions  Lymph nodes:   Cervical, supraclavicular, and axillary nodes normal  Neurologic:   CNII-XII intact, normal strength, sensation and reflexes    throughout          Assessment & Plan:

## 2023-06-12 NOTE — Assessment & Plan Note (Signed)
Ongoing issue.  Pt reports she is no longer having the burning in her stomach/chest but will have times where it feels like 'dinner is sitting right there'.  Will occasionally wake up nauseous, even when taking Protonix 40mg  daily.  Will refer to GI for complete evaluation.  Pt expressed understanding and is in agreement w/ plan.

## 2023-06-12 NOTE — Assessment & Plan Note (Signed)
Check labs and replete prn. 

## 2023-06-12 NOTE — Patient Instructions (Signed)
Follow up in 1 year or as needed We'll notify you of your lab results and make any changes if needed Continue to work on healthy diet and regular exercise- you can do it! We'll call you to schedule your GI appt to discuss both the indigestion and left lower quadrant pain Call with any questions or concerns Stay Safe!  Stay Healthy! Happy Holidays!!

## 2023-06-12 NOTE — Assessment & Plan Note (Signed)
Ongoing issue for pt.  She has had sxs for ~8 yrs.  Initially they were more frequent but she continues to have pain 'and what feels like a swelling' of LLQ.  When she has these episodes, she notices stools are smaller caliber.  Will refer back to GI for complete evaluation.  Pt expressed understanding and is in agreement w/ plan.

## 2023-06-12 NOTE — Assessment & Plan Note (Signed)
Pt's PE WNL.  UTD on pap, mammo, colonoscopy, immunizations.  Check labs.  Anticipatory guidance provided.  

## 2023-06-12 NOTE — Assessment & Plan Note (Signed)
Ongoing issue for pt.  Weight is stable.  BMI 31.2  Encouraged low carb diet and regular exercise.  Check labs to risk stratify.  Will follow.

## 2023-06-13 ENCOUNTER — Telehealth: Payer: Self-pay

## 2023-06-13 LAB — HEPATITIS C ANTIBODY: Hepatitis C Ab: NONREACTIVE

## 2023-06-13 LAB — HIV ANTIBODY (ROUTINE TESTING W REFLEX): HIV 1&2 Ab, 4th Generation: NONREACTIVE

## 2023-06-13 NOTE — Telephone Encounter (Signed)
-----   Message from Neena Rhymes sent at 06/13/2023  7:28 AM EST ----- Labs look great!  No changes at this time

## 2023-06-14 ENCOUNTER — Other Ambulatory Visit: Payer: Self-pay

## 2023-06-14 MED ORDER — VITAMIN D (ERGOCALCIFEROL) 1.25 MG (50000 UNIT) PO CAPS: 50000.0000 [IU] | ORAL_CAPSULE | ORAL | 3 refills | Status: DC

## 2023-06-14 NOTE — Telephone Encounter (Signed)
Since labs are solidly in the normal range, I would continue the 50,000 units weekly.  Ok to send prescription for #12, 3 refills

## 2023-06-14 NOTE — Telephone Encounter (Signed)
Pt has been taking High dose Vit D and looks like labs are normal should she discontinue use or continue to take this year round?   Patient has been getting refills of this med past the initial 12 weeks so she is unsure

## 2023-06-15 ENCOUNTER — Other Ambulatory Visit: Payer: Self-pay | Admitting: Family Medicine

## 2023-06-15 NOTE — Telephone Encounter (Signed)
Called and LM to inform patient, Vit D has been sent in at this time and patient just needs to be informed to continue dose

## 2023-10-03 ENCOUNTER — Other Ambulatory Visit: Payer: Self-pay | Admitting: Obstetrics and Gynecology

## 2023-10-03 DIAGNOSIS — R921 Mammographic calcification found on diagnostic imaging of breast: Secondary | ICD-10-CM

## 2023-10-17 ENCOUNTER — Encounter: Payer: Self-pay | Admitting: Family Medicine

## 2023-11-22 ENCOUNTER — Ambulatory Visit
Admission: RE | Admit: 2023-11-22 | Discharge: 2023-11-22 | Disposition: A | Discharge status: Home / Self Care | Source: Ambulatory Visit | Attending: Obstetrics and Gynecology | Admitting: Obstetrics and Gynecology

## 2023-11-22 DIAGNOSIS — R921 Mammographic calcification found on diagnostic imaging of breast: Secondary | ICD-10-CM

## 2023-11-29 ENCOUNTER — Other Ambulatory Visit: Payer: Self-pay | Admitting: Family Medicine

## 2023-12-14 IMAGING — MR MR FOOT*L* W/O CM
4 of 5 series · 20 of 40 positions shown · non-contrast
Comparison: Radiographs 04/30/2021

CLINICAL DATA: Chronic left foot pain.

EXAM:
MRI OF THE LEFT FOOT WITHOUT CONTRAST
TECHNIQUE: Multiplanar, multisequence MR imaging of the left foot was
performed. No intravenous contrast was administered.

[Series 6: T1 · coronal · 3.0mm · 0.19mm/px · 4 of 46 slices shown (1 of 2)]
[im 1/46]
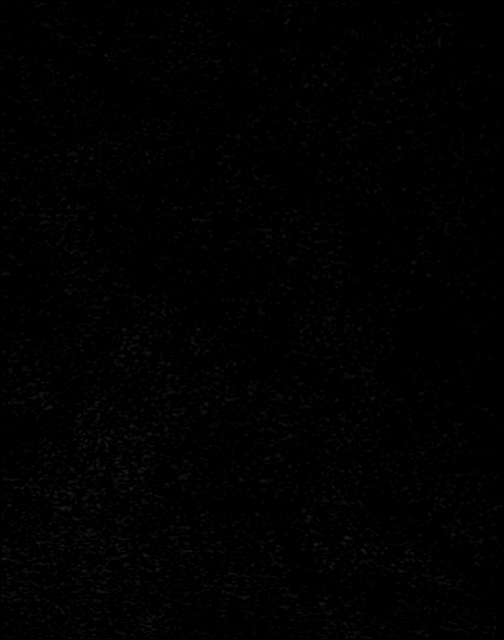
[im 7/46]
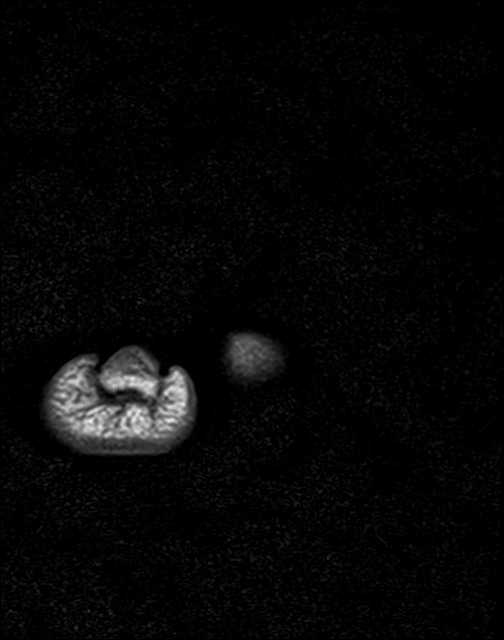
[im 25/46]
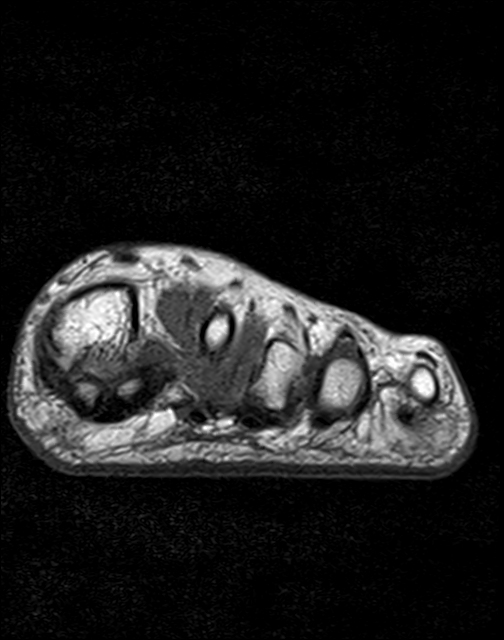
[im 39/46]
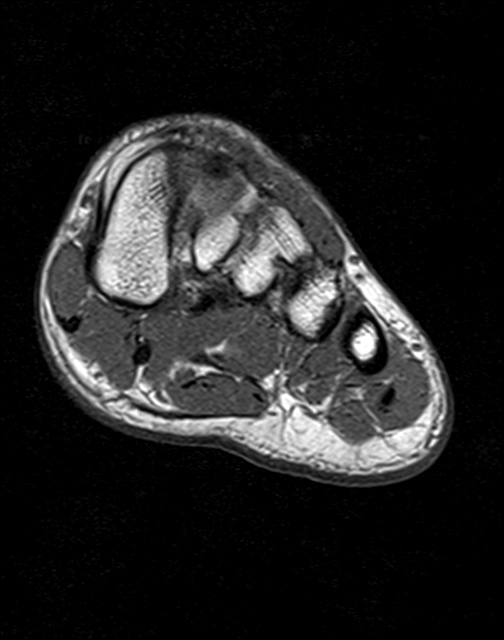

[Series 7: T2 fat-sat · axial · 3.0mm · 0.35mm/px · z∈[-155,-73]mm · 6 of 22 slices shown (1 of 2)]
[im 1/22]
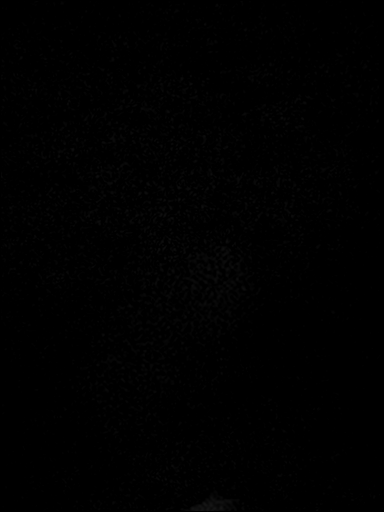
[im 5/22]
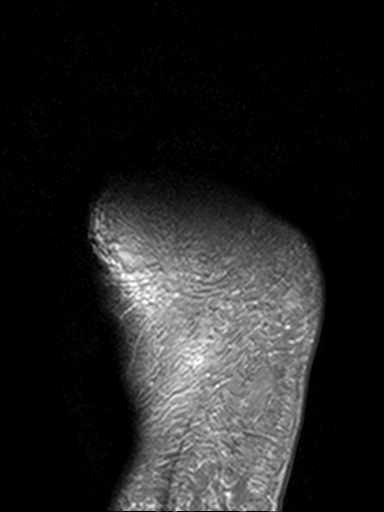
[im 9/22]
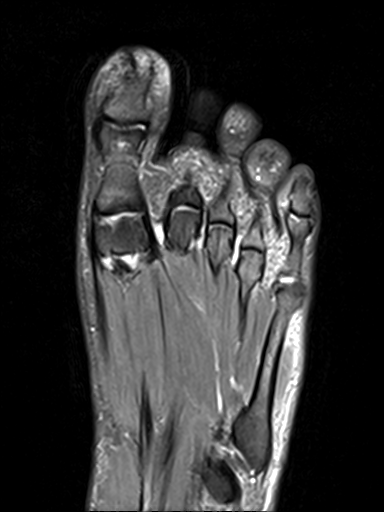
[im 13/22]
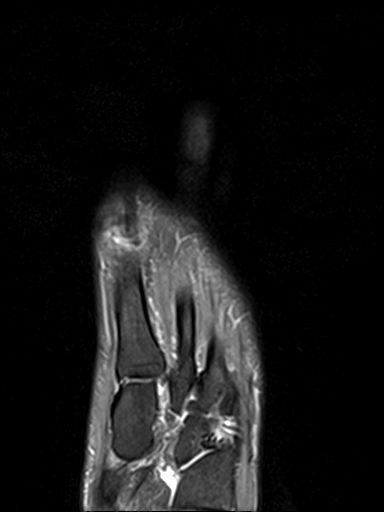
[im 17/22]
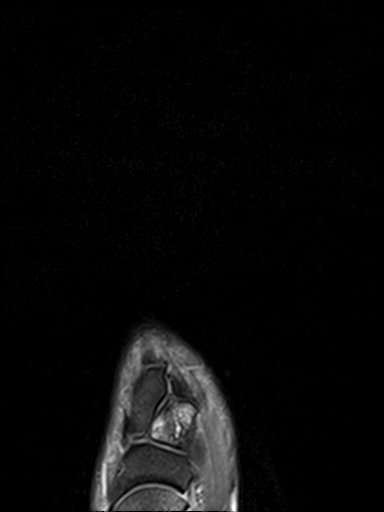
[im 22/22]
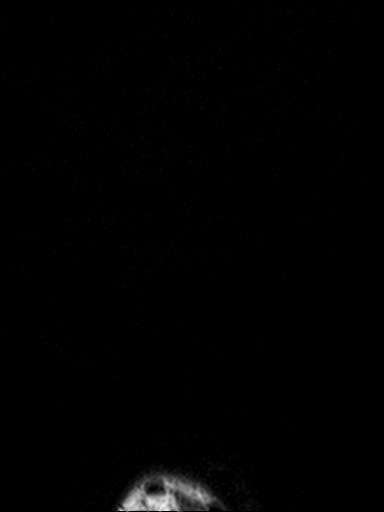

[Series 9: T2 fat-sat · coronal · 3.0mm · 0.19mm/px · 7 of 23 slices shown (2 of 2)]
[im 1/23]
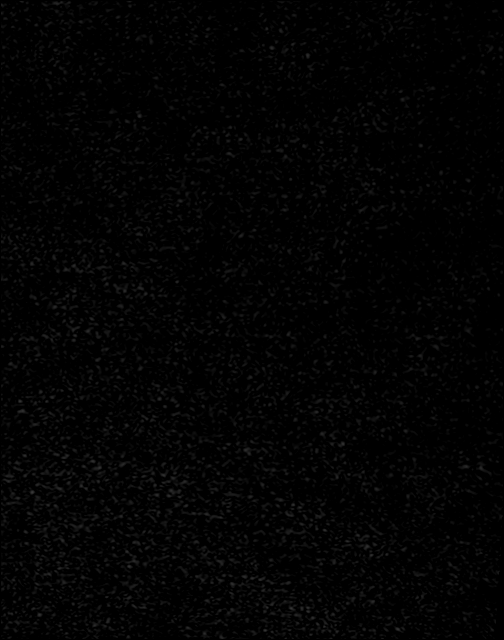
[im 4/23]
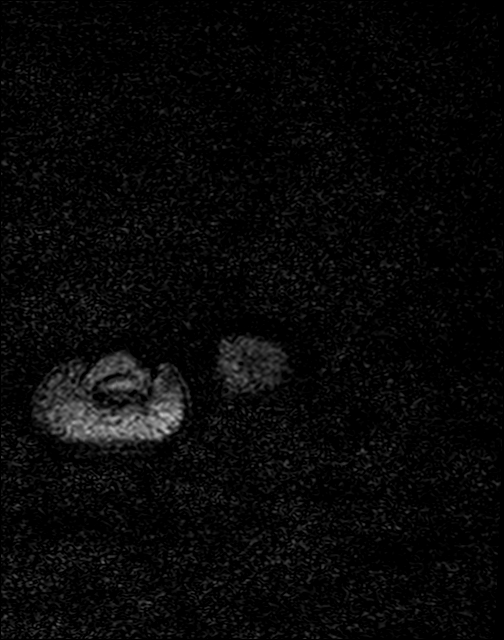
[im 8/23]
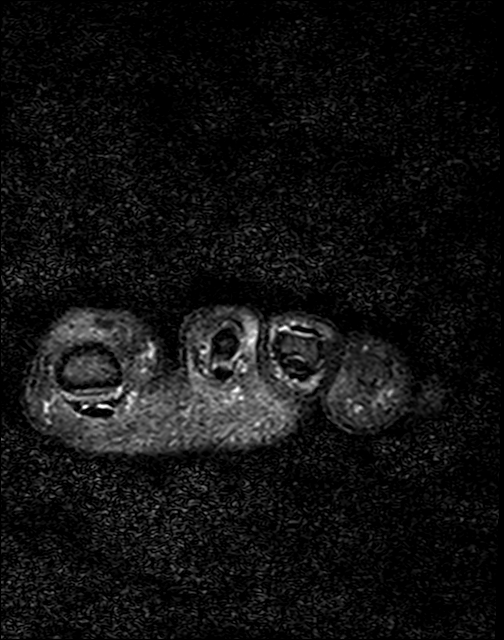
[im 12/23]
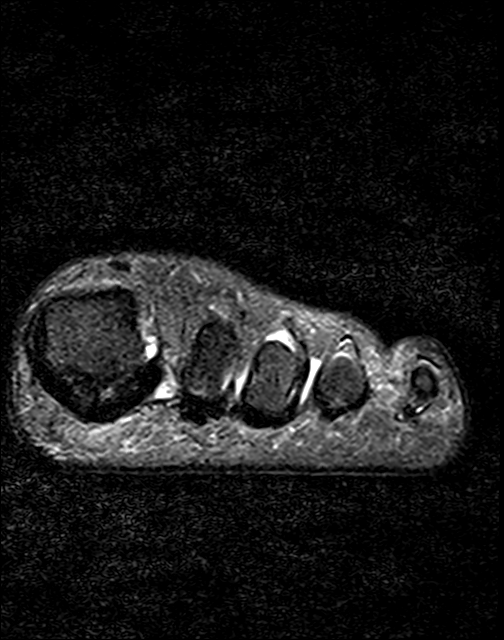
[im 15/23]
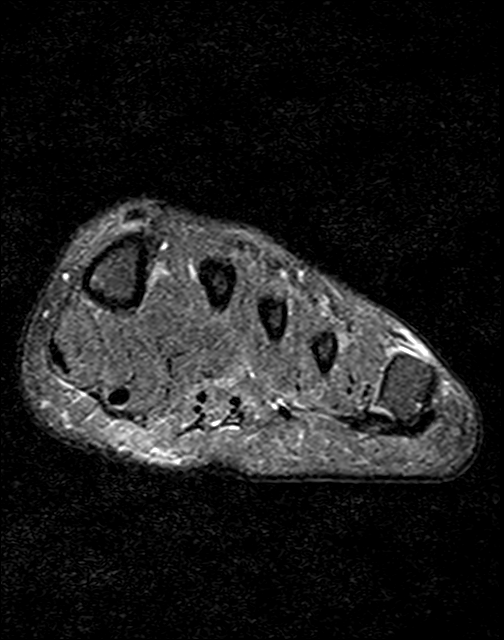
[im 19/23]
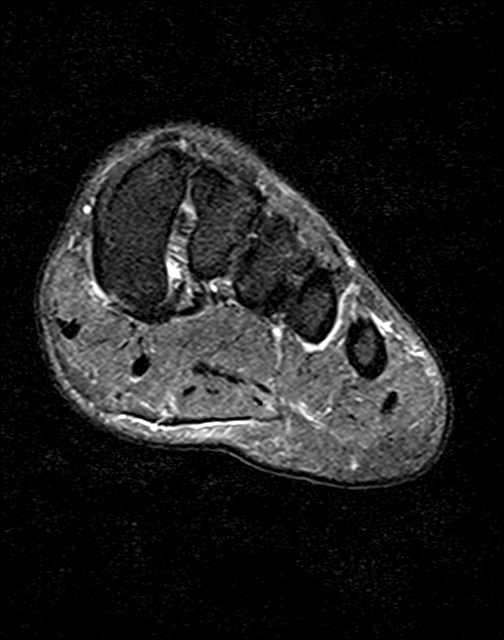
[im 23/23]
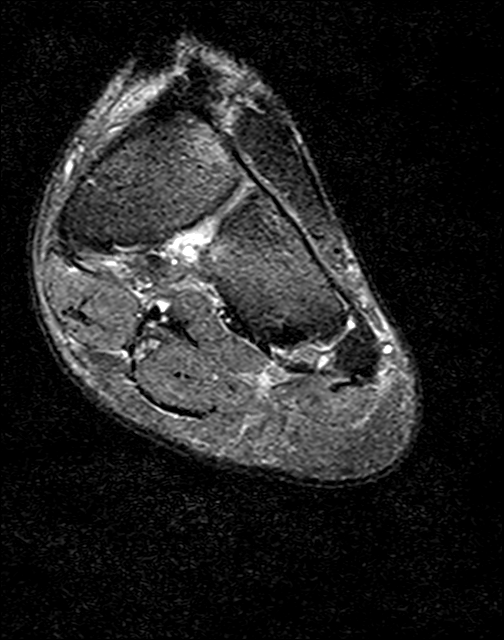

[Series 10: T1 · axial · 3.0mm · 0.35mm/px · z∈[-140,-73]mm · 3 of 22 slices shown (2 of 2)]
[im 5/22]
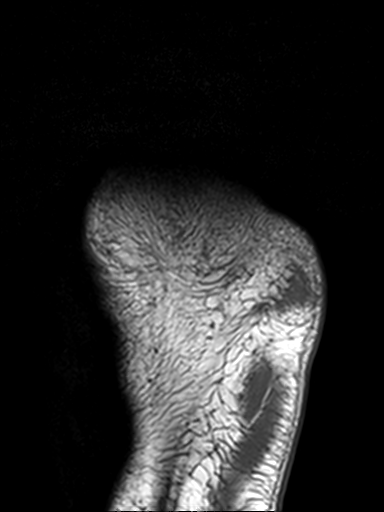
[im 13/22]
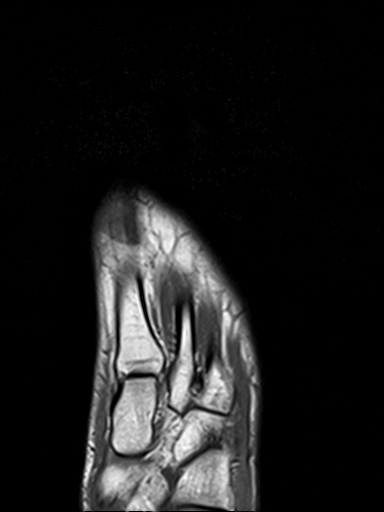
[im 22/22]
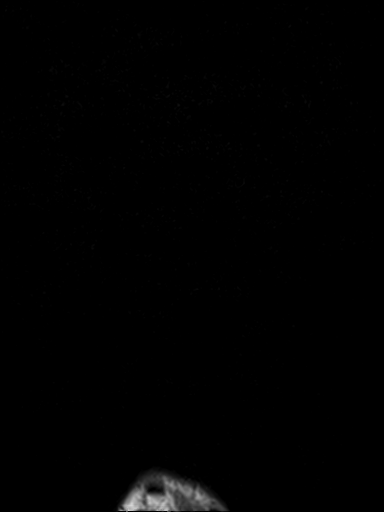

[20 of 40 positions shown; findings below may reference images not displayed]

FINDINGS: The forefoot bony structures are intact. No bone lesions or stress
fracture. Small joint effusion at the first MTP joint but no erosive
changes. The other joint spaces are normal.

Intermetatarsal fluid collections are noted consistent with
intermetatarsal bursitis.

There is T1 and T2 signal abnormality involving the dorsum of the
middle cuneiform. This could be a bone contusion, stress reaction
subtle stress fracture involving the dorsal cortex. Recommend
correlation with pain and tenderness over this area. The other
midfoot bony structures are intact. The intertarsal and metatarsal
joints are maintained.

The major tendons and ligaments are intact.

The foot musculature is unremarkable.  No muscle tear myositis.
IMPRESSION: 1. T1 and T2 signal abnormality involving the dorsum of the middle
cuneiform. This could be a bone contusion, stress reaction or subtle
stress fracture involving the dorsal cortex. Recommend correlation
with pain and tenderness over this area.
2. Intermetatarsal bursitis.
3. No forefoot stress fracture or AVN.

## 2023-12-27 ENCOUNTER — Other Ambulatory Visit: Payer: Self-pay | Admitting: Obstetrics and Gynecology

## 2023-12-27 DIAGNOSIS — R92343 Mammographic extreme density, bilateral breasts: Secondary | ICD-10-CM

## 2024-01-05 ENCOUNTER — Telehealth: Payer: Self-pay | Admitting: Family Medicine

## 2024-01-05 NOTE — Telephone Encounter (Signed)
 Patient returned called. I spoke with patient and gave her the address and phone number to [facility]. I also told her that her referral will expire 06/11/2024. Patient verbalized understanding and will call back if she has anymore questions.

## 2024-01-05 NOTE — Telephone Encounter (Signed)
 Called patient and unable to lvmtrc due to mailbox not being set up. Jacquline called patient and lvmtrc

## 2024-01-05 NOTE — Telephone Encounter (Signed)
 Copied from CRM [id number]. Topic: Referral - Question >> Jan 05, 2024  9:33 AM Eleanor C wrote:   Reason for CRM: patient was wondering if previous referral to Gastroenterology can be re-sent. It has been several months since it was sent and she now needs to make that appointment. Patient also requested that office give her a call once referral received as she stated some offices give you a call and some offices wait on your call so she was wondering if they could give her a call once they receive the referral.

## 2024-01-12 ENCOUNTER — Encounter: Payer: Self-pay | Admitting: Gastroenterology

## 2024-03-05 ENCOUNTER — Encounter: Payer: Self-pay | Admitting: Gastroenterology

## 2024-03-05 ENCOUNTER — Ambulatory Visit: Admitting: Gastroenterology

## 2024-03-05 VITALS — BP 110/72 | HR 96 | Ht 61.5 in | Wt 163.1 lb

## 2024-03-05 DIAGNOSIS — K219 Gastro-esophageal reflux disease without esophagitis: Secondary | ICD-10-CM | POA: Diagnosis not present

## 2024-03-05 DIAGNOSIS — K581 Irritable bowel syndrome with constipation: Secondary | ICD-10-CM

## 2024-03-05 DIAGNOSIS — R0989 Other specified symptoms and signs involving the circulatory and respiratory systems: Secondary | ICD-10-CM

## 2024-03-05 DIAGNOSIS — R110 Nausea: Secondary | ICD-10-CM

## 2024-03-05 DIAGNOSIS — R09A2 Foreign body sensation, throat: Secondary | ICD-10-CM

## 2024-03-05 DIAGNOSIS — Z79899 Other long term (current) drug therapy: Secondary | ICD-10-CM

## 2024-03-05 DIAGNOSIS — R6881 Early satiety: Secondary | ICD-10-CM | POA: Diagnosis not present

## 2024-03-05 DIAGNOSIS — R1032 Left lower quadrant pain: Secondary | ICD-10-CM

## 2024-03-05 MED ORDER — IBGARD 90 MG PO CPCR: ORAL_CAPSULE | ORAL | 0 refills | Status: AC

## 2024-03-05 MED ORDER — ONDANSETRON 4 MG PO TBDP: 4.0000 mg | ORAL_TABLET | Freq: Three times a day (TID) | ORAL | 1 refills | Status: DC | PRN

## 2024-03-05 MED ORDER — PANTOPRAZOLE SODIUM 40 MG PO TBEC: 40.0000 mg | DELAYED_RELEASE_TABLET | Freq: Two times a day (BID) | ORAL | 3 refills | Status: DC

## 2024-03-05 NOTE — Patient Instructions (Addendum)
  You have been scheduled for an endoscopy. Please follow written instructions given to you at your visit today.  If you use inhalers (even only as needed), please bring them with you on the day of your procedure.  If you take any of the following medications, they will need to be adjusted prior to your procedure:   DO NOT TAKE 7 DAYS PRIOR TO TEST- Trulicity (dulaglutide) Ozempic, Wegovy (semaglutide) Mounjaro (tirzepatide) Bydureon Bcise (exanatide extended release)  DO NOT TAKE 1 DAY PRIOR TO YOUR TEST Rybelsus (semaglutide) Adlyxin (lixisenatide) Victoza (liraglutide) Byetta (exanatide) ___________________________________________________________________________   We have given you samples of the following medication to take: IBgard -use as directed as needed  We have sent the following medications to your pharmacy for you to pick up at your convenience: Protonix  - take once to twice a day 30 to 60 minutes before meal(s) as needed  Zofran  ODT (orally dissolving tablet)- take every 8 hours as needed for nausea  Continue Miralax   We are giving you a Low-FODMAP diet handout today. FODMAPs are short-chain carbohydrates (sugars) that are highly fermentable, which means that they go through chemical changes in the GI system, and are poorly absorbed during digestion. When FODMAPs reach the colon (large intestine), bacteria ferment these sugars, turning them into gas and chemicals. This stretches the walls of the colon, causing abdominal bloating, distension, cramping, pain, and/or changes in bowel habits in many patients with IBS. FODMAPs are not unhealthy or harmful, but may exacerbate GI symptoms in those with sensitive GI tracts.      Thank you for entrusting me with your care and for choosing [facility], Dr. Elspeth Naval    _______________________________________________________  If your blood pressure at your visit was 140/90 or greater, please contact your  primary care physician to follow up on this.  _______________________________________________________  If you are age 50 or older, your body mass index should be between 23-30. Your Body mass index is 30.32 kg/m. If this is out of the aforementioned range listed, please consider follow up with your Primary Care Provider.  If you are age 38 or younger, your body mass index should be between 19-25. Your Body mass index is 30.32 kg/m. If this is out of the aformentioned range listed, please consider follow up with your Primary Care Provider.   ________________________________________________________  The [facility] providers would like to encourage you to use MYCHART to communicate with providers for non-urgent requests or questions.  Due to long hold times on the telephone, sending your provider a message by [facility] may be a faster and more efficient way to get a response.  Please allow 48 business hours for a response.  Please remember that this is for non-urgent requests.  _______________________________________________________  [facility] is using a team-based approach to care.  Your team is made up of your doctor and two to three APPS. Our APPS (Nurse Practitioners and Physician Assistants) work with your physician to ensure care continuity for you. They are fully qualified to address your health concerns and develop a treatment plan. They communicate directly with your gastroenterologist to care for you. Seeing the Advanced Practice Practitioners on your physician's team can help you by facilitating care more promptly, often allowing for earlier appointments, access to diagnostic testing, procedures, and other specialty referrals.

## 2024-03-05 NOTE — Progress Notes (Signed)
 HPI :  48 year old female with a history of chronic abdominal discomfort, GERD, here to establish care for reflux, bowel symptoms, abdominal pain.  She has previously seen Dr. Aneita in our office for some of these issues back in 2018.  This is my first time seeing her.  Primary care is Comer Greet, MD.  She states for years she has been having left lower quadrant pain.  Describes this as a sharp pain that she can get on the left lower side.  She had a prior CT scan during severe pain back in 2018 which showed epiploic appendagitis.  She had a follow-up pelvic ultrasound that year which did not show any concerning findings and then subsequently a colonoscopy with Dr. Aneita in August 2018 which was normal.  She was placed on MiraLAX  at the time and states that really worked to mostly relieve it.  When she does have the pain it can be relieved with a bowel movement, but generally its occurrence has been minimal since starting MiraLAX .  She does have intestinal gas and bloating that can bother her.  She states her stools however on MiraLAX  can be altered in regards to stool form, and color can vary with shades of brown but no blood in her stool.  She feels that stress levels can affect her bowel function, endorsing pencil thin stools at times.  No family history of colon cancer in first-degree relatives.  She does endorse history of indigestion and reflux that is suspected.  She can get what sounds like pyrosis into her chest that can radiate into her jaw.  Historically taking Tums has helped. she has been on Protonix  40 mg daily for the past 6 to 9 months, she states that has definitely improved things but she can still have some symptoms that bother her despite this.  She can have some nausea in the morning when she wakes up, usually does not vomit but can have sporadic nausea as well.  She also endorses some early satiety and seems to be eating less with some of her meals.  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  She notes she has gained about 20 pounds in the past year or so.  No dysphagia but she does have a sense of globus and hypersalivation at times.  She otherwise is eating pretty well.  Denies any abdominal pains postprandially  She has never had an EGD.  She denies any cardiopulmonary symptoms.  In December she had some basic labs which showed no anemia, normal LFTs, normal thyroid .  She had a negative H. pylori IgG back in March of last year  Prior workup: Abd/pelvic CT 06/2016 IMPRESSION: Mild stranding adjacent to the distal descending colon, favor epiploic appendagitis.   Pelvic US  10/2016 IMPRESSION: 1. No acute findings. No findings to account for left lower quadrant pain. 2. Status post hysterectomy.   Transvag US  10/2016 IMPRESSION: 1. No acute findings. No findings to account for left lower quadrant pain. 2. Status post hysterectomy   Colonoscopy 02/16/2017: - The perianal and digital rectal examinations were normal. - Internal hemorrhoids were found during retroflexion. The hemorrhoids were small and Grade I (internal hemorrhoids that do not prolapse). - The exam was otherwise without abnormality on direct and retroflexion views.     Past Medical History:  Diagnosis Date   Adjustment disorder with mixed anxiety and depressed  mood    Anxiety    Atypical squamous cells of undetermined significance on cytologic smear of cervix (ASC-US )    History of headache    Vitamin D  deficiency      Past Surgical History:  Procedure Laterality Date   ABDOMINAL HYSTERECTOMY     still have ovaries   ENDOMETRIAL CRYOABLATION     EYE MUSCLE SURGERY Bilateral    2 right, 4 left   TONSILLECTOMY     WISDOM TOOTH EXTRACTION     Family History  Problem Relation Age of Onset   Gallbladder disease Mother    Alcohol abuse Father    Arthritis Father    Mental  illness Father    Diabetes Father    Gallbladder disease Father    Arthritis Maternal Grandmother    Hypertension Maternal Grandmother    Arthritis Maternal Grandfather    Hypertension Maternal Grandfather    Prostate cancer Maternal Grandfather        or testicular   Skin cancer Maternal Grandfather    Colon polyps Maternal Grandfather    Heart disease Maternal Grandfather    Arthritis Paternal Grandmother    Mental illness Paternal Grandmother    Alcohol abuse Paternal Grandfather    Arthritis Paternal Grandfather    Lung cancer Paternal Grandfather        smoker   Skin cancer Paternal Grandfather        mets lung, brain   Breast cancer Neg Hx    Social History   Tobacco Use   Smoking status: Never   Smokeless tobacco: Never  Vaping Use   Vaping status: Never Used  Substance Use Topics   Alcohol use: Yes    Comment: < 1 per day, 3-4 per week   Drug use: No   Current Outpatient Medications  Medication Sig Dispense Refill   Cetirizine HCl (ZYRTEC PO) Take by mouth.     NONFORMULARY OR COMPOUNDED ITEM Diazepam     pantoprazole  (PROTONIX ) 40 MG tablet TAKE 1 TABLET BY MOUTH EVERY DAY 90 tablet 1   Polyethylene Glycol 3350  (MIRALAX  PO) Take by mouth.     Triamcinolone Acetonide (NASACORT AQ NA) Place 1 spray into the nose daily at 6 (six) AM.     Vitamin D , Ergocalciferol , (DRISDOL ) 1.25 MG (50000 UNIT) CAPS capsule Take 1 capsule (50,000 Units total) by mouth every 7 (seven) days. 12 capsule 3   No current facility-administered medications for this visit.   Allergies  Allergen Reactions   Other Itching and Rash    Cold Urticaria    Doxycycline  Other (See Comments)   Adhesive [Tape] Itching and Rash   Penicillins Other (See Comments)    Some stomach upset     Review of Systems: All systems reviewed and negative except where noted in HPI.   Lab Results  Component Value Date   WBC 4.3 06/12/2023   HGB 13.9 06/12/2023   HCT 41.4 06/12/2023   MCV 88.6  06/12/2023   PLT 211.0 06/12/2023    Lab Results  Component Value Date   NA 138 06/12/2023   CL 102 06/12/2023   K 4.0 06/12/2023   CO2 28 06/12/2023   BUN 13 06/12/2023   CREATININE 0.72 06/12/2023   GFR 100.89 06/12/2023   CALCIUM 9.2 06/12/2023   ALBUMIN 4.5 06/12/2023   GLUCOSE 93 06/12/2023    Lab Results  Component Value Date   ALT 27 06/12/2023   AST 24 06/12/2023   ALKPHOS 64 06/12/2023  BILITOT 0.4 06/12/2023     Physical Exam: BP 110/72 (BP Location: Left Arm, Patient Position: Sitting, Cuff Size: Normal)   Pulse 96   Ht 5' 1.5 (1.562 m) Comment: height measured without shoes  Wt 163 lb 2 oz (74 kg)   BMI 30.32 kg/m  Constitutional: Pleasant,well-developed, female in no acute distress. HEENT: Normocephalic and atraumatic. Conjunctivae are normal. No scleral icterus. Neck supple.  Cardiovascular: Normal rate, regular rhythm.  Pulmonary/chest: Effort normal and breath sounds normal.  Abdominal: Soft, nondistended, mild LLQ TTP without peritoneal signs. . There are no masses palpable. No hepatomegaly. Extremities: no edema Lymphadenopathy: No cervical adenopathy noted. Neurological: Alert and oriented to person place and time. Skin: Skin is warm and dry. No rashes noted. Psychiatric: Normal mood and affect. Behavior is normal.   ASSESSMENT: 48 y.o. female here for assessment of the following  1. Gastroesophageal reflux disease, unspecified whether esophagitis present   2. Globus sensation   3. Long-term current use of proton pump inhibitor therapy   4. Early satiety   5. Nausea without vomiting   6. LLQ pain   7. Irritable bowel syndrome with constipation    Discussed constellation of symptoms as outlined above.  It sounds like reflux is causing a lot of her upper tract symptoms, I suspect probably related to some of her globus as well.  She has responded somewhat to Protonix  once daily but still having some breakthrough symptoms.  Discussed options.   First, we discussed long-term risks of chronic PPI use.  Long-term want to use the lowest dose of symptoms needed to control symptoms, however her symptoms are not well-controlled right now.  Recommend trial of escalation of dosing to 40 mg twice daily, taken 36 minutes prior to meals.  Will try that for at least 1 month and await course.  Also would try to avoid eating prior to bedtime and separated by 3 to 4 hours if possible, understanding that may not be realistic on some nights.  For her nausea we can give her some Zofran  to use as needed.  Ultimately given her persistent reflux symptoms over time and early satiety etc., recommend EGD to further evaluate.  Discussed with this is, risks and benefits of the procedure and anesthesia and she wants to proceed.  Otherwise we discussed that her recent weight gain could be making reflux symptoms worse and weight loss in general can help reduce reflux symptoms and she will work on this.  Regarding her lower pain, reported epiploic appendagitis years ago, colonoscopy otherwise unremarkable at the time.  Based on her history I suspect she may have IBS see.  MiraLAX  has seemingly resolved the lower abdominal pain for the most part.  She has had some alterations in stool form, reassured her of prior workup and labs.  Would continue MiraLAX  but could try adding some fiber supplementation such as Citrucel to the regimen to bulk stools.  Regarding bloating, reviewed low FODMAP diet handout to see if there is any trigger foods that could be eliciting some of this.  She should avoid any high risk foods causing bloating.  Will add some IBgard to use as needed for any breakthrough abdominal pain or bloating that bothers her.  She is to continue with yearly labs with her primary care at upcoming physical to make sure stable.  Further recommendations pending her course on these medications and awaiting EGD  PLAN: - increase protonix  to 40mg  BID - take 30-60 min prior to a  meal -  counseled on long term risks / benefits of chronic PPIs, use lowest dose needed to control symptoms - weight loss I think would help reduce symptoms, she will work on this - try to avoid eating prior to bedtime - Zofran  4mg  ODT every 8 HRS PRN - schedule EGD at the [facility] - continue Miralax  - low FODMAP diet handout - avoid any high risk trigger foods - add IB gard PRN - samples provided - yearly labs with PCP - colonoscopy 2028 if not sooner with persistent issues  Marcey Naval, MD  Gastroenterology  CC: Mahlon Comer BRAVO, MD

## 2024-03-25 ENCOUNTER — Ambulatory Visit: Admitting: Gastroenterology

## 2024-03-25 ENCOUNTER — Encounter: Payer: Self-pay | Admitting: Gastroenterology

## 2024-03-25 VITALS — BP 107/70 | HR 74 | Temp 98.2°F | Resp 11 | Ht 61.0 in | Wt 163.0 lb

## 2024-03-25 DIAGNOSIS — K449 Diaphragmatic hernia without obstruction or gangrene: Secondary | ICD-10-CM | POA: Diagnosis not present

## 2024-03-25 DIAGNOSIS — R09A2 Foreign body sensation, throat: Secondary | ICD-10-CM

## 2024-03-25 DIAGNOSIS — K3189 Other diseases of stomach and duodenum: Secondary | ICD-10-CM | POA: Diagnosis present

## 2024-03-25 DIAGNOSIS — R110 Nausea: Secondary | ICD-10-CM

## 2024-03-25 DIAGNOSIS — K319 Disease of stomach and duodenum, unspecified: Secondary | ICD-10-CM | POA: Diagnosis not present

## 2024-03-25 DIAGNOSIS — K219 Gastro-esophageal reflux disease without esophagitis: Secondary | ICD-10-CM

## 2024-03-25 DIAGNOSIS — R6881 Early satiety: Secondary | ICD-10-CM

## 2024-03-25 DIAGNOSIS — K317 Polyp of stomach and duodenum: Secondary | ICD-10-CM

## 2024-03-25 MED ORDER — SODIUM CHLORIDE 0.9 % IV SOLN
500.0000 mL | Freq: Once | INTRAVENOUS | Status: AC
Start: 2024-03-25 — End: ?

## 2024-03-25 NOTE — Progress Notes (Signed)
 Report given to PACU, vss

## 2024-03-25 NOTE — Progress Notes (Signed)
 Called to room to assist during endoscopic procedure.  Patient ID and intended procedure confirmed with present staff. Received instructions for my participation in the procedure from the performing physician.

## 2024-03-25 NOTE — Patient Instructions (Signed)
 YOU HAD AN ENDOSCOPIC PROCEDURE TODAY AT THE [facility]:   Refer to the procedure report that was given to you for any specific questions about what was found during the examination.  If the procedure report does not answer your questions, please call your gastroenterologist to clarify.  If you requested that your care partner not be given the details of your procedure findings, then the procedure report has been included in a sealed envelope for you to review at your convenience later.  YOU SHOULD EXPECT: Some feelings of bloating in the abdomen. Passage of more gas than usual.  Walking can help get rid of the air that was put into your GI tract during the procedure and reduce the bloating. If you had a lower endoscopy (such as a colonoscopy or flexible sigmoidoscopy) you may notice spotting of blood in your stool or on the toilet paper. If you underwent a bowel prep for your procedure, you may not have a normal bowel movement for a few days.  Please Note:  You might notice some irritation and congestion in your nose or some drainage.  This is from the oxygen used during your procedure.  There is no need for concern and it should clear up in a day or so.  SYMPTOMS TO REPORT IMMEDIATELY:   Following upper endoscopy (EGD)  Vomiting of blood or coffee ground material  New chest pain or pain under the shoulder blades  Painful or persistently difficult swallowing  New shortness of breath  Fever of 100F or higher  Black, tarry-looking stools  For urgent or emergent issues, a gastroenterologist can be reached at any hour by calling [phone]. Do not use MyChart messaging for urgent concerns.    DIET:  We do recommend a small meal at first, but then you may proceed to your regular diet.  Drink plenty of fluids but you should avoid alcoholic beverages for 24 hours.  MEDICATIONS: Continue present medications. Trial of Protonix  TWICE daily every day for a few weeks to see if this can  better control symptoms. Long term use the lowest dose needed to control symptoms. You have a current prescription with refills for this medication at your pharmacy.  FOLLOW UP: Await pathology results.  Educational handouts given to patient: Hiatal Hernia.  Thank you for allowing us  to provide for your healthcare needs today.  ACTIVITY:  You should plan to take it easy for the rest of today and you should NOT DRIVE or use heavy machinery until tomorrow (because of the sedation medicines used during the test).    FOLLOW UP: Our staff will call the number listed on your records the next business day following your procedure.  We will call around 7:15- 8:00 am to check on you and address any questions or concerns that you may have regarding the information given to you following your procedure. If we do not reach you, we will leave a message.     If any biopsies were taken you will be contacted by phone or by letter within the next 1-3 weeks.  Please call us  at [phone] if you have not heard about the biopsies in 3 weeks.    SIGNATURES/CONFIDENTIALITY: You and/or your care partner have signed paperwork which will be entered into your electronic medical record.  These signatures attest to the fact that that the information above on your After Visit Summary has been reviewed and is understood.  Full responsibility of the confidentiality of this discharge information lies  with you and/or your care-partner.

## 2024-03-25 NOTE — Progress Notes (Signed)
0959 Robinul 0.1 mg IV given due large amount of secretions upon assessment.  MD made aware, vss 

## 2024-03-25 NOTE — Op Note (Signed)
 [facility] Patient Name: Victoria Burch Procedure Date: 03/25/2024 9:48 AM MRN: [id number] Endoscopist: Elspeth P. Leigh , MD, [id number] Age: 48 Referring MD:  Date of Birth: 1975-08-10 Gender: Female Account #: [id number] Procedure:                Upper GI endoscopy Indications:              follow-up of gastro-esophageal reflux disease,                            Early satiety, Globus sensation, Nausea - on                            protonix  40mg  / day Medicines:                Monitored Anesthesia Care Procedure:                Pre-Anesthesia Assessment:                           - Prior to the procedure, a History and Physical                            was performed, and patient medications and                            allergies were reviewed. The patient's tolerance of                            previous anesthesia was also reviewed. The risks                            and benefits of the procedure and the sedation                            options and risks were discussed with the patient.                            All questions were answered, and informed consent                            was obtained. Prior Anticoagulants: The patient has                            taken no anticoagulant or antiplatelet agents. ASA                            Grade Assessment: II - A patient with mild systemic                            disease. After reviewing the risks and benefits,                            the patient was deemed in satisfactory condition to  undergo the procedure.                           After obtaining informed consent, the endoscope was                            passed under direct vision. Throughout the                            procedure, the patient's blood pressure, pulse, and                            oxygen saturations were monitored continuously. The                            Olympus Scope SN [id number] was  introduced through the                            mouth, and advanced to the second part of duodenum.                            The upper GI endoscopy was accomplished without                            difficulty. The patient tolerated the procedure                            well. Scope In: Scope Out: Findings:                 Esophagogastric landmarks were identified: the                            Z-line was found at 39 cm, the gastroesophageal                            junction was found at 39 cm and the upper extent of                            the gastric folds was found at 40 cm from the                            incisors.                           A 1 cm hiatal hernia was present.                           The exam of the esophagus was otherwise normal. No                            Barrett's and no erosive changes.                           Biopsies were obtained from the proximal and  distal                            esophagus with cold forceps to rule out                            eosinophilic esophagitis.                           Multiple small sessile polyps were found in the                            gastric fundus and in the gastric body. Suspect                            likely fundic gland polyps in the setting of PPI                            use. Representative biopsies were taken with a cold                            forceps for histology, ensure no adenomatous                            changes.                           The exam of the stomach was otherwise normal.                           Biopsies were taken with a cold forceps for                            Helicobacter pylori testing.                           The examined duodenum was normal. Complications:            No immediate complications. Estimated blood loss:                            Minimal. Estimated Blood Loss:     Estimated blood loss was minimal. Impression:               -  Esophagogastric landmarks identified.                           - 1 cm hiatal hernia.                           - Normal esophagus otherwise - biopsies taken to                            ensure no EoE.                           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 Biopsied.                           - Normal stomach otherwise - biopsies taken to rule                            out H pylori in light of symptoms.                           - Normal examined duodenum. Recommendation:           - Patient has a contact number available for                            emergencies. The signs and symptoms of potential                            delayed complications were discussed with the                            patient. Return to normal activities tomorrow.                            Written discharge instructions were provided to the                            patient.                           - Resume previous diet.                           - Continue present medications.                           - Trial of protonix  twice daily every day for a few                            weeks to see if this can better control symptoms.                            Long term use the lowest dose needed to control                            symptoms.                           - Await pathology results. Elspeth P. Edwyn Inclan, MD 03/25/2024 10:20:04 AM This report has been signed electronically.

## 2024-03-25 NOTE — Progress Notes (Signed)
 History and Physical Interval Note: seen on 03/05/24 - no interval changes.   Have increased protonix  to twice daily since office visit. Added Zofran  PRN. First time EGD to evaluate symptoms. Hard to say if higher dose protonix  has helped much, she has had a hard time remembering to take it. Trying to eat dinner earlier in the evening, which has helped some. Otherwise, no complaints today, she wishes to proceed.     03/25/2024 10:05 AM  Victoria Burch  has presented today for endoscopic procedure(s), with the diagnosis of  Encounter Diagnoses  Name Primary?   Gastroesophageal reflux disease, unspecified whether esophagitis present Yes   Nausea without vomiting    Globus sensation    Early satiety   .  The various methods of evaluation and treatment have been discussed with the patient and/or family. After consideration of risks, benefits and other options for treatment, the patient has consented to  the endoscopic procedure(s).   The patient's history has been reviewed, patient examined, no change in status, stable for surgery.  I have reviewed the patient's chart and labs.  Questions were answered to the patient's satisfaction.    Marcey Naval, MD [facility]

## 2024-03-26 ENCOUNTER — Telehealth: Payer: Self-pay

## 2024-03-26 NOTE — Telephone Encounter (Signed)
  Follow up Call-     03/25/2024    9:20 AM  Call back number  Post procedure Call Back phone  # [phone]  Permission to leave phone message Yes     Patient questions:  Do you have a fever, pain , or abdominal swelling? No. Pain Score  0 *  Have you tolerated food without any problems? Yes.    Have you been able to return to your normal activities? Yes.    Do you have any questions about your discharge instructions: Diet   No. Medications  No. Follow up visit  No.  Do you have questions or concerns about your Care? No.  Actions: * If pain score is 4 or above: No action needed, pain <4.

## 2024-03-28 LAB — SURGICAL PATHOLOGY

## 2024-03-31 ENCOUNTER — Ambulatory Visit: Payer: Self-pay | Admitting: Gastroenterology

## 2024-05-08 ENCOUNTER — Inpatient Hospital Stay
Admission: RE | Admit: 2024-05-08 | Discharge: 2024-05-08 | Disposition: A | Payer: Self-pay | Source: Ambulatory Visit | Attending: Obstetrics and Gynecology | Admitting: Obstetrics and Gynecology

## 2024-05-08 DIAGNOSIS — R92343 Mammographic extreme density, bilateral breasts: Secondary | ICD-10-CM

## 2024-05-08 MED ORDER — GADOPICLENOL 0.5 MMOL/ML IV SOLN
7.0000 mL | Freq: Once | INTRAVENOUS | Status: AC | PRN
  Administered 2024-05-08: 7 mL via INTRAVENOUS

## 2024-06-17 ENCOUNTER — Encounter: Payer: Self-pay | Admitting: Family Medicine

## 2024-06-17 ENCOUNTER — Ambulatory Visit (INDEPENDENT_AMBULATORY_CARE_PROVIDER_SITE_OTHER): Payer: Managed Care, Other (non HMO) | Admitting: Family Medicine

## 2024-06-17 VITALS — BP 116/72 | HR 89 | Temp 97.9°F | Resp 14 | Ht 61.0 in | Wt 162.0 lb

## 2024-06-17 DIAGNOSIS — Z0000 Encounter for general adult medical examination without abnormal findings: Secondary | ICD-10-CM | POA: Diagnosis not present

## 2024-06-17 DIAGNOSIS — E669 Obesity, unspecified: Secondary | ICD-10-CM

## 2024-06-17 DIAGNOSIS — E559 Vitamin D deficiency, unspecified: Secondary | ICD-10-CM | POA: Diagnosis not present

## 2024-06-17 NOTE — Progress Notes (Signed)
" ° °  Subjective:    Patient ID: Victoria Burch, female    DOB: 09-20-1975, 48 y.o.   MRN: [id number]  HPI CPE- UTD on Tdap, mammo, colonoscopy, flu  Patient Care Team    Relationship Specialty Notifications Start End  Mahlon Comer BRAVO, MD PCP - General Family Medicine  01/15/16   Estelle Service, MD Consulting Physician Obstetrics and Gynecology  01/20/17     Health Maintenance  Topic Date Due   Hepatitis B Vaccines 19-59 Average Risk (3 of 3 - 3-dose series) 08/05/1996   COVID-19 Vaccine (5 - 2025-26 season) 07/03/2024 (Originally 02/26/2024)   DTaP/Tdap/Td (9 - Td or Tdap) 08/26/2025   Mammogram  11/21/2025   Colonoscopy  02/17/2027   Influenza Vaccine  Completed   Hepatitis C Screening  Completed   HIV Screening  Completed   Pneumococcal Vaccine  Aged Out   HPV VACCINES  Aged Out   Meningococcal B Vaccine  Aged Out      Review of Systems Patient reports no vision/ hearing changes, adenopathy,fever, weight change,  persistant/recurrent hoarseness , swallowing issues, chest pain, palpitations, edema, persistant/recurrent cough, hemoptysis, dyspnea (rest/exertional/paroxysmal nocturnal), gastrointestinal bleeding (melena, rectal bleeding), abdominal pain, significant heartburn, bowel changes, GU symptoms (dysuria, hematuria, incontinence), Gyn symptoms (abnormal  bleeding, pain),  syncope, focal weakness, memory loss, numbness & tingling, skin/hair/nail changes, abnormal bruising or bleeding, anxiety, or depression.     Objective:   Physical Exam General Appearance:    Alert, cooperative, no distress, appears stated age  Head:    Normocephalic, without obvious abnormality, atraumatic  Eyes:    PERRL, conjunctiva/corneas clear, EOM's intact both eyes  Ears:    Normal TM's and external ear canals, both ears  Nose:   Nares normal, septum midline, mucosa normal, no drainage    or sinus tenderness  Throat:   Lips, mucosa, and tongue normal; teeth and gums normal  Neck:   Supple,  symmetrical, trachea midline, no adenopathy;    Thyroid : no enlargement/tenderness/nodules  Back:     Symmetric, no curvature, ROM normal, no CVA tenderness  Lungs:     Clear to auscultation bilaterally, respirations unlabored  Chest Wall:    No tenderness or deformity   Heart:    Regular rate and rhythm, S1 and S2 normal, no murmur, rub   or gallop  Breast Exam:    Deferred to GYN  Abdomen:     Soft, non-tender, bowel sounds active all four quadrants,    no masses, no organomegaly  Genitalia:    Deferred to GYN  Rectal:    Extremities:   Extremities normal, atraumatic, no cyanosis or edema  Pulses:   2+ and symmetric all extremities  Skin:   Skin color, texture, turgor normal, no rashes or lesions  Lymph nodes:   Cervical, supraclavicular, and axillary nodes normal  Neurologic:   CNII-XII intact, normal strength, sensation and reflexes    throughout          Assessment & Plan:    "

## 2024-06-17 NOTE — Patient Instructions (Signed)
Follow up in 1 year or as needed We'll notify you of your lab results and make any changes if needed Continue to work on healthy diet and regular exercise- you can do it! Call with any questions or concerns Stay Safe!  Stay Healthy! Happy Holidays!!! 

## 2024-06-17 NOTE — Assessment & Plan Note (Signed)
 Pt's PE WNL w/ exception of BMI.  UTD on Tdap, mammo, colonoscopy, flu.  Will get labs done at husband's work (free).  Anticipatory guidance provided.

## 2024-06-23 ENCOUNTER — Other Ambulatory Visit: Payer: Self-pay | Admitting: Family Medicine

## 2024-06-24 NOTE — Telephone Encounter (Signed)
 Okay to refill high dose vitamin d ?

## 2024-07-20 ENCOUNTER — Encounter: Payer: Self-pay | Admitting: Family Medicine

## 2024-08-01 NOTE — Progress Notes (Unsigned)
 "     Ellouise Console, PA-C [address] [place], [place]  [address] Phone: [phone]   Primary Care Physician: Mahlon Comer BRAVO, MD  Primary Gastroenterologist:  Ellouise Console, PA-C / Elspeth Naval, MD   Chief Complaint: Rectal bleeding, acid reflux       HPI:   Discussed the use of AI scribe software for clinical note transcription with the patient, who gave verbal consent to proceed.  01/2017 last colonoscopy by Dr. Aneita (to evaluate rectal bleeding and constipation): Small internal hemorrhoids, otherwise normal with no polyps.  Excellent prep.  Repeat screening colonoscopy age 23.  02/2024 EGD by Dr. Naval (for GERD, early satiety, nausea on PPI): 1 cm hiatal hernia.  Multiple small benign fundic gland gastric polyps.  Otherwise normal esophagus, stomach, and duodenum.  Protonix  was increased to twice daily for better control of GERD.  Biopsies negative for H. pylori, intestinal metaplasia, EOE, or Barrett's.  History of Present Illness Lataya Varnell Salomone Luke is a 49 year old female with chronic constipation, internal hemorrhoids, and GERD who presents for evaluation of rectal bleeding.  Rectal Bleeding and Internal Hemorrhoids: - Recent episode of rectal bleeding with a fair amount of blood on the tissue with wiping and blood sitting on top of a soft, formed bowel movement - No blood mixed within the stool or toilet water, and no melena - No straining during bowel movements - No identifiable precipitating factors - Last colonoscopy in 2018 revealed small internal hemorrhoids  Altered Bowel Habits and Chronic Constipation: - Chronic constipation managed with daily Miralax  - Stool caliber and consistency are highly variable, with frequent very thin, 'pencil-like' stools that pass easily - Occasional low stool volume for 1-2 days before resuming regular, soft bowel movements - Increased gassiness associated with Miralax  use - Concern regarding possible internal  process causing change in stool diameter  Gastroesophageal Reflux Disease and Throat Symptoms: - Chronic throat clearing fluctuates in severity, sometimes persisting for hours and then resolving for days - Intermittent globus sensation - Uncertainty whether symptoms are due to chronic sinus drainage, reflux, or both - GERD managed with pantoprazole , now taken in the evening with improved effect - Upper endoscopy in September 2025 showed a very small hiatal hernia and a few benign stomach polyps; remainder of esophagus, stomach, and duodenum were normal  Pelvic Floor Spasms and Perineal Pain: - Frequent pelvic floor spasms occurring at night - Pain localized between the vagina and rectum - Vaginal Valium suppositories prescribed by her gynecologist     Current Outpatient Medications  Medication Sig Dispense Refill   Cetirizine HCl (ZYRTEC PO) Take by mouth.     famotidine  (PEPCID ) 40 MG tablet Take 1 tablet (40 mg total) by mouth at bedtime. 90 tablet 3   hydrocortisone  (ANUSOL -HC) 25 MG suppository Place 1 suppository (25 mg total) rectally at bedtime. 12 suppository 1   Na Sulfate-K Sulfate-Mg Sulfate concentrate (SUPREP) 17.5-3.13-1.6 GM/177ML SOLN Take 1 kit (354 mLs total) by mouth once for 1 dose. 354 mL 0   NONFORMULARY OR COMPOUNDED ITEM Diazepam     pantoprazole  (PROTONIX ) 40 MG tablet Take 1 tablet (40 mg total) by mouth every evening. 30 tablet 11   Peppermint Oil (IBGARD) 90 MG CPCR Take as directed 8 capsule 0   Polyethylene Glycol 3350  (MIRALAX  PO) Take by mouth.     Triamcinolone Acetonide (NASACORT AQ NA) Place 1 spray into the nose daily at 6 (six) AM.     Vitamin D , Ergocalciferol , (DRISDOL )  1.25 MG (50000 UNIT) CAPS capsule Take 1 capsule (50,000 Units total) by mouth every 7 (seven) days. 12 capsule 3   Current Facility-Administered Medications  Medication Dose Route Frequency Provider Last Rate Last Admin   0.9 %  sodium chloride  infusion  500 mL Intravenous Once  Armbruster, Elspeth SQUIBB, MD        Allergies as of 08/02/2024 - Review Complete 08/02/2024  Allergen Reaction Noted   Other Itching and Rash 11/22/2016   Adhesive [tape] Itching and Rash 11/22/2016   Doxycycline  Rash 03/10/2023   Penicillins Other (See Comments) 01/15/2016    Past Medical History:  Diagnosis Date   Adjustment disorder with mixed anxiety and depressed mood    Allergy 20 years ago   Anxiety    Atypical squamous cells of undetermined significance on cytologic smear of cervix (ASC-US )    History of headache    IBS (irritable bowel syndrome)    Vitamin D  deficiency     Past Surgical History:  Procedure Laterality Date   ABDOMINAL HYSTERECTOMY     still have ovaries   ENDOMETRIAL CRYOABLATION     EYE MUSCLE SURGERY Bilateral    2 right, 4 left   TONSILLECTOMY     WISDOM TOOTH EXTRACTION      Review of Systems:    All systems reviewed and negative except where noted in HPI.    Physical Exam:  BP 124/80   Pulse 100   Ht 5' 1 (1.549 m)   Wt 161 lb (73 kg)   BMI 30.42 kg/m  No LMP recorded. Patient has had a hysterectomy.  General: Well-nourished, well-developed in no acute distress.  Lungs: Clear to auscultation bilaterally. Non-labored. Heart: Regular rate and rhythm, no murmurs rubs or gallops.  Abdomen: Bowel sounds are normal; Abdomen is Soft; No hepatosplenomegaly, masses or hernias;  No Abdominal Tenderness; No guarding or rebound tenderness. Neuro: Alert and oriented x 3.  Grossly intact.  Psych: Alert and cooperative, normal mood and affect. Rectal:  One small external skin tag.  NO swollen external hemorrhoids.  Tight anal sphincter tone.  No rectal masses or tenderness.  No gross blood.  Chaperone for Exam:  Thurshell Ratliff, CMA    Imaging Studies: No results found.  Labs: CBC    Component Value Date/Time   WBC 4.3 06/12/2023 0811   RBC 4.67 06/12/2023 0811   HGB 13.9 06/12/2023 0811   HCT 41.4 06/12/2023 0811   PLT 211.0  06/12/2023 0811   MCV 88.6 06/12/2023 0811   MCHC 33.5 06/12/2023 0811   RDW 13.7 06/12/2023 0811   LYMPHSABS 1.4 06/12/2023 0811   MONOABS 0.4 06/12/2023 0811   EOSABS 0.2 06/12/2023 0811   BASOSABS 0.0 06/12/2023 0811    CMP     Component Value Date/Time   NA 138 06/12/2023 0811   NA 139 03/27/2009 0000   K 4.0 06/12/2023 0811   CL 102 06/12/2023 0811   CO2 28 06/12/2023 0811   GLUCOSE 93 06/12/2023 0811   BUN 13 06/12/2023 0811   BUN 19 03/27/2009 0000   CREATININE 0.72 06/12/2023 0811   CALCIUM 9.2 06/12/2023 0811   PROT 6.8 06/12/2023 0811   ALBUMIN 4.5 06/12/2023 0811   AST 24 06/12/2023 0811   ALT 27 06/12/2023 0811   ALKPHOS 64 06/12/2023 0811   BILITOT 0.4 06/12/2023 0811     Assessment and Plan:   FALESHA SCHOMMER is a 49 y.o. y/o female presents for:  1.  Mild Rectal bleeding /  Colon cancer screening Suspect internal hemorrhoid bleeding.  She is currently due for screening colonoscopy due to age. - Scheduling Colonoscopy I discussed risks of colonoscopy with patient to include risk of bleeding, colon perforation, and risk of sedation.  Patient expressed understanding and agrees to proceed with colonoscopy.   2.  Internal hemorrhoids - Rx. Hydrocortisone  suppository 25mg  1 into rectum at bedtime x 12 days. - Continue treatment for constipation. - Discussed internal hemorrhoid banding after colonoscopy if hemorrhoid symptoms persist.  3.  Constipation - Continue MiraLAX  1 capful daily - Start Benefiber 1 TBSP in a drink daily  4.  GERD - Continue Protonix  40 mg once daily before dinner - Add Famotidine  40mg  QAM  Assessment & Plan Rectal bleeding due to internal hemorrhoids Recent mild rectal bleeding likely due to internal hemorrhoids. Updated evaluation warranted due to age and time since last colonoscopy. - Performed brief rectal examination for visible hemorrhoids. - Prescribed rectal suppository once daily at bedtime for 12 days. - Scheduled  repeat colonoscopy to evaluate rectal bleeding.  Chronic constipation Longstanding constipation managed with polyethylene glycol. Variable stool caliber likely due to stool consistency and water absorption. Further evaluation indicated. - Recommended Benefiber (one tablespoon daily) with polyethylene glycol to increase stool bulk. - Reassured Benefiber unlikely to increase flatulence.  Gastroesophageal reflux disease Chronic throat clearing may be due to reflux or postnasal drainage. High-dose PPI therapy limited due to potential organ risks. - Advised continuation of pantoprazole  once daily in the evening before dinner. - Prescribed famotidine  40 mg once daily in the morning for persistent symptoms.  Colon cancer screening Due for updated colonoscopy per guidelines for age and new-onset rectal bleeding. - Scheduled repeat colonoscopy for colon cancer screening and rectal bleeding evaluation. - Planned to use colon cancer screening and rectal bleeding as diagnosis codes for insurance.   Ellouise Console, PA-C  Follow up 4 weeks after Colonoscopy.   "

## 2024-08-02 ENCOUNTER — Encounter: Payer: Self-pay | Admitting: Physician Assistant

## 2024-08-02 ENCOUNTER — Ambulatory Visit: Admitting: Physician Assistant

## 2024-08-02 VITALS — BP 124/80 | HR 100 | Ht 61.0 in | Wt 161.0 lb

## 2024-08-02 DIAGNOSIS — K625 Hemorrhage of anus and rectum: Secondary | ICD-10-CM

## 2024-08-02 DIAGNOSIS — Z1211 Encounter for screening for malignant neoplasm of colon: Secondary | ICD-10-CM

## 2024-08-02 DIAGNOSIS — K648 Other hemorrhoids: Secondary | ICD-10-CM

## 2024-08-02 DIAGNOSIS — K5904 Chronic idiopathic constipation: Secondary | ICD-10-CM

## 2024-08-02 DIAGNOSIS — K219 Gastro-esophageal reflux disease without esophagitis: Secondary | ICD-10-CM

## 2024-08-02 MED ORDER — NA SULFATE-K SULFATE-MG SULF 17.5-3.13-1.6 GM/177ML PO SOLN: 1.0000 | Freq: Once | ORAL | 0 refills | Status: AC

## 2024-08-02 MED ORDER — HYDROCORTISONE ACETATE 25 MG RE SUPP: 25.0000 mg | Freq: Every day | RECTAL | 1 refills | Status: AC

## 2024-08-02 MED ORDER — PANTOPRAZOLE SODIUM 40 MG PO TBEC: 40.0000 mg | DELAYED_RELEASE_TABLET | Freq: Every evening | ORAL | 11 refills | Status: AC

## 2024-08-02 MED ORDER — FAMOTIDINE 40 MG PO TABS: 40.0000 mg | ORAL_TABLET | Freq: Every day | ORAL | 3 refills | Status: AC

## 2024-08-02 NOTE — Patient Instructions (Addendum)
" ° ° °  Start OTC Benefiber Powder. Mix 1 - 2 Tablespoons in 6 - 8 ounces of a Drink Once Daily. Drink 64 ounces of water / fluids Daily.   For constipation: Start OTC Miralax  Powder Mix 1 capful in 6 to 8 ounces of a drink once daily  Recommend high-fiber diet, 30 g of fiber daily Eat fruits, vegetables, and whole grains Drink 64 ounces of water / fluids daily.    We have sent the following medications to your pharmacy for you to pick up at your convenience: famotidine , pantoprazole  and hydrocortisone  suppositories.   It has been recommended to you by your physician that you have a(n) colonoscopy completed. Per your request, we did not schedule the procedure(s) today. Please contact our office at [phone] should you decide to have the procedure completed. You will be scheduled for a pre-visit and procedure at that time.  _______________________________________________________  If your blood pressure at your visit was 140/90 or greater, please contact your primary care physician to follow up on this.  _______________________________________________________  If you are age 72 or older, your body mass index should be between 23-30. Your Body mass index is 30.42 kg/m. If this is out of the aforementioned range listed, please consider follow up with your Primary Care Provider.  If you are age 52 or younger, your body mass index should be between 19-25. Your Body mass index is 30.42 kg/m. If this is out of the aformentioned range listed, please consider follow up with your Primary Care Provider.   ________________________________________________________  The [facility] providers would like to encourage you to use MYCHART to communicate with providers for non-urgent requests or questions.  Due to long hold times on the telephone, sending your provider a message by [facility] may be a faster and more efficient way to get a response.  Please allow 48 business hours for a response.  Please  remember that this is for non-urgent requests.  _______________________________________________________  [facility] is using a team-based approach to care.  Your team is made up of your doctor and two to three APPS. Our APPS (Nurse Practitioners and Physician Assistants) work with your physician to ensure care continuity for you. They are fully qualified to address your health concerns and develop a treatment plan. They communicate directly with your gastroenterologist to care for you. Seeing the Advanced Practice Practitioners on your physician's team can help you by facilitating care more promptly, often allowing for earlier appointments, access to diagnostic testing, procedures, and other specialty referrals.   "

## 2024-08-14 ENCOUNTER — Encounter: Admitting: Gastroenterology

## 2025-06-18 ENCOUNTER — Encounter: Admitting: Family Medicine
